# Patient Record
Sex: Female | Born: 1995 | Race: Black or African American | Hispanic: No | Marital: Single | Smoking: Former smoker
Health system: Southern US, Community
[De-identification: ages and names within clinical notes are randomized; demographics above are authoritative.]

## PROBLEM LIST (undated history)

## (undated) ENCOUNTER — Inpatient Hospital Stay (HOSPITAL_COMMUNITY): Payer: Self-pay

## (undated) DIAGNOSIS — Z789 Other specified health status: Secondary | ICD-10-CM

## (undated) HISTORY — PX: WRIST SURGERY: SHX841

## (undated) HISTORY — DX: Other specified health status: Z78.9

---
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
## 2015-02-12 ENCOUNTER — Encounter (HOSPITAL_COMMUNITY): Payer: Self-pay | Admitting: Emergency Medicine

## 2015-02-12 ENCOUNTER — Emergency Department (HOSPITAL_COMMUNITY)
Admission: EM | Admit: 2015-02-12 | Discharge: 2015-02-13 | Discharge status: Against Medical Advice | Payer: Medicaid Other | Attending: Emergency Medicine | Admitting: Emergency Medicine

## 2015-02-12 DIAGNOSIS — Z720 Tobacco use: Secondary | ICD-10-CM | POA: Insufficient documentation

## 2015-02-12 DIAGNOSIS — R300 Dysuria: Secondary | ICD-10-CM | POA: Insufficient documentation

## 2015-02-12 DIAGNOSIS — R1030 Lower abdominal pain, unspecified: Secondary | ICD-10-CM | POA: Insufficient documentation

## 2015-02-12 DIAGNOSIS — R202 Paresthesia of skin: Secondary | ICD-10-CM | POA: Insufficient documentation

## 2015-02-12 LAB — POC URINE PREG, ED: Preg Test, Ur: NEGATIVE

## 2015-02-12 NOTE — ED Notes (Signed)
Pt states she's had a tingling sensation when she urinates for the last week and a half and is also having lower abdominal pain. States unknown if she could be pregnant. Denies and abnormal bleeding/discharge from genital area. Denies back pain, N/V/D, fever/chills.

## 2015-02-13 LAB — URINALYSIS, ROUTINE W REFLEX MICROSCOPIC
Bilirubin Urine: NEGATIVE
GLUCOSE, UA: NEGATIVE mg/dL
Hgb urine dipstick: NEGATIVE
KETONES UR: NEGATIVE mg/dL
NITRITE: NEGATIVE
PROTEIN: NEGATIVE mg/dL
Specific Gravity, Urine: 1.022 (ref 1.005–1.030)
Urobilinogen, UA: 1 mg/dL (ref 0.0–1.0)
pH: 6.5 (ref 5.0–8.0)

## 2015-02-13 LAB — URINE MICROSCOPIC-ADD ON

## 2015-02-13 NOTE — ED Notes (Signed)
Pt reports that she cannot stay any longer and will possibly come back tomorrow.  Informed pt of risks associated w/ leaving AMA and pt verbalized understanding.

## 2015-05-26 ENCOUNTER — Inpatient Hospital Stay (HOSPITAL_COMMUNITY)
Admission: AD | Admit: 2015-05-26 | Discharge: 2015-05-26 | Disposition: A | Discharge status: Home / Self Care | Payer: Self-pay | Source: Ambulatory Visit | Attending: Obstetrics and Gynecology | Admitting: Obstetrics and Gynecology

## 2015-05-26 ENCOUNTER — Encounter (HOSPITAL_COMMUNITY): Payer: Self-pay | Admitting: Medical

## 2015-05-26 DIAGNOSIS — Z3201 Encounter for pregnancy test, result positive: Secondary | ICD-10-CM | POA: Insufficient documentation

## 2015-05-26 LAB — POCT PREGNANCY, URINE: Preg Test, Ur: POSITIVE — AB

## 2015-05-26 NOTE — MAU Provider Note (Signed)
[name] is a 20 y.o. G1P0 at [redacted]w[redacted]d who presents to MAU today for pregnancy verification. The patient denies abdominal pain or vaginal bleeding today.   BP 119/72 mmHg  Pulse 79  Temp(Src) 98.2 F (36.8 C)  Resp 18  LMP 03/29/2015  CONSTITUTIONAL: Well-developed, well-nourished female in no acute distress.  ENT: External right and left ear normal.  EYES: EOM intact, conjunctivae normal.  MUSCULOSKELETAL: Normal range of motion.  CARDIOVASCULAR: Regular heart rate RESPIRATORY: Normal effort NEUROLOGICAL: Alert and oriented to person, place, and time.  SKIN: Skin is warm and dry. No rash noted. Not diaphoretic. No erythema. No pallor. PSYCH: Normal mood and affect. Normal behavior. Normal judgment and thought content.  Results for orders placed or performed during the hospital encounter of 05/26/15 (from the past 24 hour(s))  Pregnancy, urine POC     Status: Abnormal   Collection Time: 05/26/15  1:15 PM  Result Value Ref Range   Preg Test, Ur POSITIVE (A) NEGATIVE    A: Positive pregnancy test  P: Discharge home Pregnancy confirmation letter and list of area OB providers given Patient advised to start taking prenatal vitamins List of OTC medications safe in pregnancy given First trimester warning signs reviewed Patient may return to MAU as needed or if her condition were to change or worsen   Marny Lowenstein, PA-C  05/26/2015 1:24 PM

## 2015-05-26 NOTE — MAU Note (Signed)
Pt presents to MAU stating that she wants to know how fra pregnant she is. Pt denies any vaginal bleeding or pain

## 2015-05-26 NOTE — Discharge Instructions (Signed)
Prenatal Care °WHAT IS PRENATAL CARE?  °Prenatal care is the process of caring for a pregnant woman before she gives birth. Prenatal care makes sure that she and her baby remain as healthy as possible throughout pregnancy. Prenatal care may be provided by a midwife, family practice health care provider, or a childbirth and pregnancy specialist (obstetrician). Prenatal care may include physical examinations, testing, treatments, and education on nutrition, lifestyle, and social support services. °WHY IS PRENATAL CARE SO IMPORTANT?  °Early and consistent prenatal care increases the chance that you and your baby will remain healthy throughout your pregnancy. This type of care also decreases a baby's risk of being born too early (prematurely), or being born smaller than expected (small for gestational age). Any underlying medical conditions you may have that could pose a risk during your pregnancy are discussed during prenatal care visits. You will also be monitored regularly for any new conditions that may arise during your pregnancy so they can be treated quickly and effectively. °WHAT HAPPENS DURING PRENATAL CARE VISITS? °Prenatal care visits may include the following: °Discussion °Tell your health care provider about any new signs or symptoms you have experienced since your last visit. These might include: °· Nausea or vomiting. °· Increased or decreased level of energy. °· Difficulty sleeping. °· Back or leg pain. °· Weight changes. °· Frequent urination. °· Shortness of breath with physical activity. °· Changes in your skin, such as the development of a rash or itchiness. °· Vaginal discharge or bleeding. °· Feelings of excitement or nervousness. °· Changes in your baby's movements. °You may want to write down any questions or topics you want to discuss with your health care provider and bring them with you to your appointment. °Examination °During your first prenatal care visit, you will likely have a complete  physical exam. Your health care provider will often examine your vagina, cervix, and the position of your uterus, as well as check your heart, lungs, and other body systems. As your pregnancy progresses, your health care provider will measure the size of your uterus and your baby's position inside your uterus. He or she may also examine you for early signs of labor. Your prenatal visits may also include checking your blood pressure and, after about 10-12 weeks of pregnancy, listening to your baby's heartbeat. °Testing °Regular testing often includes: °· Urinalysis. This checks your urine for glucose, protein, or signs of infection. °· Blood count. This checks the levels of white and red blood cells in your body. °· Tests for sexually transmitted infections (STIs). Testing for STIs at the beginning of pregnancy is routinely done and is required in many states. °· Antibody testing. You will be checked to see if you are immune to certain illnesses, such as rubella, that can affect a developing fetus. °· Glucose screen. Around 24-28 weeks of pregnancy, your blood glucose level will be checked for signs of gestational diabetes. Follow-up tests may be recommended. °· Group B strep. This is a bacteria that is commonly found inside a woman's vagina. This test will inform your health care provider if you need an antibiotic to reduce the amount of this bacteria in your body prior to labor and childbirth. °· Ultrasound. Many pregnant women undergo an ultrasound screening around 18-20 weeks of pregnancy to evaluate the health of the fetus and check for any developmental abnormalities. °· HIV (human immunodeficiency virus) testing. Early in your pregnancy, you will be screened for HIV. If you are at high risk for HIV, this test   may be repeated during your third trimester of pregnancy. You may be offered other testing based on your age, personal or family medical history, or other factors.  HOW OFTEN SHOULD I PLAN TO SEE MY  HEALTH CARE PROVIDER FOR PRENATAL CARE? Your prenatal care check-up schedule depends on any medical conditions you have before, or develop during, your pregnancy. If you do not have any underlying medical conditions, you will likely be seen for checkups:  Monthly, during the first 6 months of pregnancy.  Twice a month during months 7 and 8 of pregnancy.  Weekly starting in the 9th month of pregnancy and until delivery. If you develop signs of early labor or other concerning signs or symptoms, you may need to see your health care provider more often. Ask your health care provider what prenatal care schedule is best for you. WHAT CAN I DO TO KEEP MYSELF AND MY BABY AS HEALTHY AS POSSIBLE DURING MY PREGNANCY?  Take a prenatal vitamin containing 400 micrograms (0.4 mg) of folic acid every day. Your health care provider may also ask you to take additional vitamins such as iodine, vitamin D, iron, copper, and zinc.  Take 1500-2000 mg of calcium daily starting at your 20th week of pregnancy until you deliver your baby.  Make sure you are up to date on your vaccinations. Unless directed otherwise by your health care provider:  You should receive a tetanus, diphtheria, and pertussis (Tdap) vaccination between the 27th and 36th week of your pregnancy, regardless of when your last Tdap immunization occurred. This helps protect your baby from whooping cough (pertussis) after he or she is born.  You should receive an annual inactivated influenza vaccine (IIV) to help protect you and your baby from influenza. This can be done at any point during your pregnancy.  Eat a well-rounded diet that includes:  Fresh fruits and vegetables.  Lean proteins.  Calcium-rich foods such as milk, yogurt, hard cheeses, and dark, leafy greens.  Whole grain breads.  Do noteat seafood high in mercury, including:  Swordfish.  Tilefish.  Shark.  King mackerel.  More than 6 oz tuna per week.  Do not eat:  Raw  or undercooked meats or eggs.  Unpasteurized foods, such as soft cheeses (brie, blue, or feta), juices, and milks.  Lunch meats.  Hot dogs that have not been heated until they are steaming.  Drink enough water to keep your urine clear or pale yellow. For many women, this may be 10 or more 8 oz glasses of water each day. Keeping yourself hydrated helps deliver nutrients to your baby and may prevent the start of pre-term uterine contractions.  Do not use any tobacco products including cigarettes, chewing tobacco, or electronic cigarettes. If you need help quitting, ask your health care provider.  Do not drink beverages containing alcohol. No safe level of alcohol consumption during pregnancy has been determined.  Do not use any illegal drugs. These can harm your developing baby or cause a miscarriage.  Ask your health care provider or pharmacist before taking any prescription or over-the-counter medicines, herbs, or supplements.  Limit your caffeine intake to no more than 200 mg per day.  Exercise. Unless told otherwise by your health care provider, try to get 30 minutes of moderate exercise most days of the week. Do not  do high-impact activities, contact sports, or activities with a high risk of falling, such as horseback riding or downhill skiing.  Get plenty of rest.  Avoid anything that raises your  body temperature, such as hot tubs and saunas.  If you own a cat, do not empty its litter box. Bacteria contained in cat feces can cause an infection called toxoplasmosis. This can result in serious harm to the fetus.  Stay away from chemicals such as insecticides, lead, mercury, and cleaning or paint products that contain solvents.  Do not have any X-rays taken unless medically necessary.  Take a childbirth and breastfeeding preparation class. Ask your health care provider if you need a referral or recommendation.   This information is not intended to replace advice given to you by  your health care provider. Make sure you discuss any questions you have with your health care provider.   Document Released: 04/25/2003 Document Revised: 05/13/2014 Document Reviewed: 07/07/2013 Elsevier Interactive Patient Education 2016 [employer] First Trimester of Pregnancy The first trimester of pregnancy is from week 1 until the end of week 12 (months 1 through 3). During this time, your baby will begin to develop inside you. At 6-8 weeks, the eyes and face are formed, and the heartbeat can be seen on ultrasound. At the end of 12 weeks, all the baby's organs are formed. Prenatal care is all the medical care you receive before the birth of your baby. Make sure you get good prenatal care and follow all of your doctor's instructions. HOME CARE  Medicines  Take medicine only as told by your doctor. Some medicines are safe and some are not during pregnancy.  Take your prenatal vitamins as told by your doctor.  Take medicine that helps you poop (stool softener) as needed if your doctor says it is okay. Diet  Eat regular, healthy meals.  Your doctor will tell you the amount of weight gain that is right for you.  Avoid raw meat and uncooked cheese.  If you feel sick to your stomach (nauseous) or throw up (vomit):  Eat 4 or 5 small meals a day instead of 3 large meals.  Try eating a few soda crackers.  Drink liquids between meals instead of during meals.  If you have a hard time pooping (constipation):  Eat high-fiber foods like fresh vegetables, fruit, and whole grains.  Drink enough fluids to keep your pee (urine) clear or pale yellow. Activity and Exercise  Exercise only as told by your doctor. Stop exercising if you have cramps or pain in your lower belly (abdomen) or low back.  Try to avoid standing for long periods of time. Move your legs often if you must stand in one place for a long time.  Avoid heavy lifting.  Wear low-heeled shoes. Sit and stand up  straight.  You can have sex unless your doctor tells you not to. Relief of Pain or Discomfort  Wear a good support bra if your breasts are sore.  Take warm water baths (sitz baths) to soothe pain or discomfort caused by hemorrhoids. Use hemorrhoid cream if your doctor says it is okay.  Rest with your legs raised if you have leg cramps or low back pain.  Wear support hose if you have puffy, bulging veins (varicose veins) in your legs. Raise (elevate) your feet for 15 minutes, 3-4 times a day. Limit salt in your diet. Prenatal Care  Schedule your prenatal visits by the twelfth week of pregnancy.  Write down your questions. Take them to your prenatal visits.  Keep all your prenatal visits as told by your doctor. Safety  Wear your seat belt at all times when driving.  Make a  list of emergency phone numbers. The list should include numbers for family, friends, the hospital, and police and fire departments. General Tips  Ask your doctor for a referral to a local prenatal class. Begin classes no later than at the start of month 6 of your pregnancy.  Ask for help if you need counseling or help with nutrition. Your doctor can give you advice or tell you where to go for help.  Do not use hot tubs, steam rooms, or saunas.  Do not douche or use tampons or scented sanitary pads.  Do not cross your legs for long periods of time.  Avoid litter boxes and soil used by cats.  Avoid all smoking, herbs, and alcohol. Avoid drugs not approved by your doctor.  Do not use any tobacco products, including cigarettes, chewing tobacco, and electronic cigarettes. If you need help quitting, ask your doctor. You may get counseling or other support to help you quit.  Visit your dentist. At home, brush your teeth with a soft toothbrush. Be gentle when you floss. GET HELP IF:  You are dizzy.  You have mild cramps or pressure in your lower belly.  You have a nagging pain in your belly area.  You  continue to feel sick to your stomach, throw up, or have watery poop (diarrhea).  You have a bad smelling fluid coming from your vagina.  You have pain with peeing (urination).  You have increased puffiness (swelling) in your face, hands, legs, or ankles. GET HELP RIGHT AWAY IF:   You have a fever.  You are leaking fluid from your vagina.  You have spotting or bleeding from your vagina.  You have very bad belly cramping or pain.  You gain or lose weight rapidly.  You throw up blood. It may look like coffee grounds.  You are around people who have [place] measles, fifth disease, or chickenpox.  You have a very bad headache.  You have shortness of breath.  You have any kind of trauma, such as from a fall or a car accident.   This information is not intended to replace advice given to you by your health care provider. Make sure you discuss any questions you have with your health care provider.   Document Released: 10/09/2007 Document Revised: 05/13/2014 Document Reviewed: 03/02/2013 Elsevier Interactive Patient Education [employer]

## 2015-07-25 ENCOUNTER — Ambulatory Visit (INDEPENDENT_AMBULATORY_CARE_PROVIDER_SITE_OTHER): Payer: Self-pay | Admitting: Advanced Practice Midwife

## 2015-07-25 ENCOUNTER — Encounter: Payer: Self-pay | Admitting: Advanced Practice Midwife

## 2015-07-25 ENCOUNTER — Encounter: Payer: Self-pay | Admitting: Family Medicine

## 2015-07-25 VITALS — BP 122/58 | HR 89 | Temp 98.5°F | Wt 123.5 lb

## 2015-07-25 DIAGNOSIS — Z23 Encounter for immunization: Secondary | ICD-10-CM

## 2015-07-25 DIAGNOSIS — Z113 Encounter for screening for infections with a predominantly sexual mode of transmission: Secondary | ICD-10-CM

## 2015-07-25 DIAGNOSIS — Z3402 Encounter for supervision of normal first pregnancy, second trimester: Secondary | ICD-10-CM

## 2015-07-25 DIAGNOSIS — Z3689 Encounter for other specified antenatal screening: Secondary | ICD-10-CM

## 2015-07-25 DIAGNOSIS — Z3400 Encounter for supervision of normal first pregnancy, unspecified trimester: Secondary | ICD-10-CM | POA: Insufficient documentation

## 2015-07-25 LAB — POCT URINALYSIS DIP (DEVICE)
BILIRUBIN URINE: NEGATIVE
Glucose, UA: NEGATIVE mg/dL
Ketones, ur: NEGATIVE mg/dL
NITRITE: NEGATIVE
Protein, ur: NEGATIVE mg/dL
Specific Gravity, Urine: 1.025 (ref 1.005–1.030)
Urobilinogen, UA: 0.2 mg/dL (ref 0.0–1.0)
pH: 7 (ref 5.0–8.0)

## 2015-07-25 NOTE — Progress Notes (Signed)
   Subjective:    Catherine Hayes is a G1P0 [id number] being seen today for her first obstetrical visit.  Her obstetrical history is significant for none G1. Pregnancy history fully reviewed.  Patient reports no complaints.  Filed Vitals:   07/25/15 1347  BP: 122/58  Pulse: 89  Temp: 98.5 F (36.9 C)  Weight: 123 lb 8 oz (56.019 kg)    HISTORY: OB History  Gravida Para Term Preterm AB SAB TAB Ectopic Multiple Living  1             # Outcome Date GA Lbr Len/2nd Weight Sex Delivery Anes PTL Lv  1 Current              History reviewed. No pertinent past medical history. Past Surgical History  Procedure Laterality Date  . Wrist surgery    . Anterior lat lumbar fusion N/A 08/03/2012    Procedure: ANTERIOR LATERAL LUMBAR L1 CORPECTOMY 1 LEVEL;  Surgeon: Tia Alertavid S Jones, MD;  Location: [facility];  Service: Neurosurgery;  Laterality: N/A;  Anteriolateral lumbar one corpectomy, strut graft and plating   History reviewed. No pertinent family history.   Exam    Uterus:     Pelvic Exam: Deferred r/t pt age  System: Breast:  normal appearance, no masses or tenderness   Skin: normal coloration and turgor, no rashes    Neurologic: oriented, normal, normal mood, gait normal; reflexes normal and symmetric   Extremities: normal strength, tone, and muscle mass, ROM of all joints is normal   HEENT sclera clear, anicteric   Mouth/Teeth mucous membranes moist, pharynx normal without lesions and dental hygiene good   Neck supple and no masses   Cardiovascular: Heart sound wnl   Respiratory:  appears well, vitals normal, no respiratory distress, acyanotic, normal RR, ear and throat exam is normal, neck free of mass or lymphadenopathy, chest clear, no wheezing, crepitations, rhonchi, normal symmetric air entry   Abdomen: soft, non-tender; bowel sounds normal; no masses,  no organomegaly   Urinary: not evaluated      Assessment:    Pregnancy: G1P0 1. Encounter for supervision of normal first  pregnancy in second trimester  - Prenatal Vit-Fe Fumarate-FA (PRENATAL VITAMINS PLUS) 27-1 MG TABS; Take 1 tablet by mouth daily. - POCT urinalysis dip (device) - Culture, OB Urine - GC[facility]Chlamydia probe amp ([facility] - Hemoglobinopathy evaluation - US MFM OB COMP + 14 WK; Future - Prenatal Profile - Prescript Monitor Profile(19) - AFP, Quad Screen - Flu Vaccine QUAD 36+ mos IM; Standing - Flu Vaccine QUAD 36+ mos IM  2. Encounter for fetal anatomic survey  - US MFM OB COMP + 14 WK; Future      Plan:     Initial labs drawn. Prenatal vitamins. Problem list reviewed and updated. Genetic Screening discussed Quad Screen: ordered.  Ultrasound discussed; fetal survey: ordered.  Follow up in 4 weeks.   LEFTWICH-KIRBY, Caroleann Casler 07/25/2015

## 2015-07-25 NOTE — Patient Instructions (Addendum)
Your ultrasound is scheduled on April 6 at 10:15 am, please arrive at 10:00.     Considering Waterbirth? Guide for patients at [facility]  Why consider waterbirth?  . Gentle birth for babies . Less pain medicine used in labor . May allow for passive descent/less pushing . May reduce perineal tears  . More mobility and instinctive maternal position changes . Increased maternal relaxation . Reduced blood pressure in labor  Is waterbirth safe? What are the risks of infection, drowning or other complications?  . Infection: o Very low risk (3.7 % for tub vs 4.8% for bed) o 7 in 8000 waterbirths with documented infection o Poorly cleaned equipment most common cause o Slightly lower group B strep transmission rate  . Drowning o Maternal:  - Very low risk   - Related to seizures or fainting o Newborn:  - Very low risk. No evidence of increased risk of respiratory problems in multiple large studies - Physiological protection from breathing under water - Avoid underwater birth if there are any fetal complications - Once baby's head is out of the water, keep it out.  . Birth complication o Some reports of cord trauma, but risk decreased by bringing baby to surface gradually o No evidence of increased risk of shoulder dystocia. Mothers can usually change positions faster in water than in a bed, possibly aiding the maneuvers to free the shoulder.   Am I a candidate for waterbirth?  Yes, if you are: . Full-term (37 weeks or greater)  . Have had an uncomplicated pregnancy and labor  No, if you have: Marland Kitchen. Preterm birth less than 37 weeks . Thick, particulate meconium stained fluid . Maternal fever over 101 . Heavy bleeding or signs of placental abruption . Pre-eclampsia  . Any abnormal fetal heart rate pattern . Breech presentation . Twins  . Very large baby . Active communicable infection (this does NOT include group B strep) . Significant limitation to  mobility  Please remember that birth is unpredictable. Under certain unforeseeable circumstances your provider may advise against giving birth in the tub. These decisions will be made on a case-by-case basis and with the safety of you and your baby as our highest priority.  Requirements for patients planning waterbirth  . Ask your midwife if you will be a candidate for waterbirth. . Attend the Noelle [facility]Waterbirth Class [facility]. Contact Childbirth Education at [phone] or [phone] for dates and times. The class is free and we strongly encourage you to bring your support person. You will receive a certificate of participation to show to your midwife or doctor. . Supplies needed for [facility] and [facility] patients: o Single-use disposable tub liner (birthpoolinabox.com  REGULAR size) o New garden hose labeled "lead-free", "suitable for drinking water", "non-toxic" OR "water potable" o Garden hose to remove the dirty water o Faucet adaptor to attach hose to faucet         o Electric drain pump to remove water (We recommend 792 gallon per hour or greater pump.)  o Fish net o Bathing suit top (optional) o Long-handled mirror (optional)  [email or web address] sells tubs for $120 if you would rather purchase your own tub

## 2015-07-25 NOTE — Progress Notes (Signed)
Pain/pressure- lower abd  New ob packet given  Flu vaccine consented and info given  Anatomy US scheduled for April 6th @ 1000

## 2015-07-26 LAB — PRENATAL PROFILE (SOLSTAS)
ANTIBODY SCREEN: NEGATIVE
Basophils Absolute: 0 10*3/uL (ref 0.0–0.1)
Basophils Relative: 0 % (ref 0–1)
EOS ABS: 0.1 10*3/uL (ref 0.0–0.7)
EOS PCT: 1 % (ref 0–5)
HCT: 35.8 % — ABNORMAL LOW (ref 36.0–46.0)
HIV 1&2 Ab, 4th Generation: NONREACTIVE
Hemoglobin: 12.2 g/dL (ref 12.0–15.0)
Hepatitis B Surface Ag: NEGATIVE
LYMPHS ABS: 1.5 10*3/uL (ref 0.7–4.0)
Lymphocytes Relative: 13 % (ref 12–46)
MCH: 31 pg (ref 26.0–34.0)
MCHC: 34.1 g/dL (ref 30.0–36.0)
MCV: 91.1 fL (ref 78.0–100.0)
MONO ABS: 0.8 10*3/uL (ref 0.1–1.0)
MONOS PCT: 7 % (ref 3–12)
MPV: 9.7 fL (ref 8.6–12.4)
Neutro Abs: 8.9 10*3/uL — ABNORMAL HIGH (ref 1.7–7.7)
Neutrophils Relative %: 79 % — ABNORMAL HIGH (ref 43–77)
PLATELETS: 248 10*3/uL (ref 150–400)
RBC: 3.93 MIL/uL (ref 3.87–5.11)
RDW: 13.7 % (ref 11.5–15.5)
RH TYPE: POSITIVE
RUBELLA: 3.97 {index} — AB (ref <0.90)
WBC: 11.3 10*3/uL — ABNORMAL HIGH (ref 4.0–10.5)

## 2015-07-26 LAB — AFP, QUAD SCREEN
AFP: 38.5 ng/mL
Age Alone: 1:1180 {titer}
CURR GEST AGE: 16.6 wks.days
HCG, Total: 31.4 IU/mL
INH: 151.7 pg/mL
Interpretation-AFP: NEGATIVE
MoM for AFP: 0.96
MoM for INH: 0.81
MoM for hCG: 0.84
Open Spina bifida: NEGATIVE
Tri 18 Scr Risk Est: NEGATIVE
Trisomy 18 (Edward) Syndrome Interp.: 1:13200 {titer}
UE3 MOM: 0.64
UE3 VALUE: 0.67 ng/mL

## 2015-07-26 LAB — CULTURE, OB URINE
Colony Count: NO GROWTH
Organism ID, Bacteria: NO GROWTH

## 2015-07-26 LAB — GC/CHLAMYDIA PROBE AMP (~~LOC~~) NOT AT ARMC
Chlamydia: POSITIVE — AB
Neisseria Gonorrhea: POSITIVE — AB

## 2015-07-27 LAB — HEMOGLOBINOPATHY EVALUATION
HGB F QUANT: 0 % (ref 0.0–2.0)
HGB S QUANTITAION: 0 %
Hemoglobin Other: 0 %
Hgb A2 Quant: 2.8 % (ref 2.2–3.2)
Hgb A: 97.2 % (ref 96.8–97.8)

## 2015-07-28 ENCOUNTER — Telehealth: Payer: Self-pay | Admitting: *Deleted

## 2015-07-28 ENCOUNTER — Encounter: Payer: Self-pay | Admitting: Advanced Practice Midwife

## 2015-07-28 ENCOUNTER — Encounter: Payer: Self-pay | Admitting: *Deleted

## 2015-07-28 DIAGNOSIS — A64 Unspecified sexually transmitted disease: Secondary | ICD-10-CM | POA: Insufficient documentation

## 2015-07-28 DIAGNOSIS — O98319 Other infections with a predominantly sexual mode of transmission complicating pregnancy, unspecified trimester: Secondary | ICD-10-CM

## 2015-07-28 NOTE — Telephone Encounter (Signed)
Patient has positive gonorrhea and chlamydia test. Attempted to call patient on mobile number, patient picked up but connection was bad and call was disconnected. Was unable to leave message due to voice mail not being set up yet. Called mother's number. Left message on voice mail stating I tried patient's number but was disconnected and unable to leave message. Please have her call the clinic for test results on Monday. STD card completed.

## 2015-07-29 LAB — PRESCRIPTION MONITORING PROFILE (19 PANEL)
AMPHETAMINE/METH: NEGATIVE ng/mL
BUPRENORPHINE, URINE: NEGATIVE ng/mL
Barbiturate Screen, Urine: NEGATIVE ng/mL
Benzodiazepine Screen, Urine: NEGATIVE ng/mL
Carisoprodol, Urine: NEGATIVE ng/mL
Cocaine Metabolites: NEGATIVE ng/mL
Creatinine, Urine: 208.15 mg/dL (ref >20.0)
ECSTASY: NEGATIVE ng/mL
FENTANYL URINE: NEGATIVE ng/mL
METHADONE SCREEN, URINE: NEGATIVE ng/mL
Meperidine, Ur: NEGATIVE ng/mL
Methaqualone: NEGATIVE ng/mL
NITRITES URINE, INITIAL: NEGATIVE ug/mL
OXYCODONE SCRN UR: NEGATIVE ng/mL
Opiate Screen, Urine: NEGATIVE ng/mL
PROPOXYPHENE: NEGATIVE ng/mL
Phencyclidine, Ur: NEGATIVE ng/mL
TAPENTADOLUR: NEGATIVE ng/mL
TRAMADOL UR: NEGATIVE ng/mL
Zolpidem, Urine: NEGATIVE ng/mL
pH, Initial: 6.9 pH (ref 4.5–8.9)

## 2015-07-29 LAB — CANNABANOIDS (GC/LC/MS), URINE: THC-COOH (GC/LC/MS), ur confirm: 1311 ng/mL — AB (ref <5)

## 2015-07-31 NOTE — Telephone Encounter (Signed)
Called pt and left message stating that I am calling with important test result information. Please call back and leave message stating whether a detailed message can be left on her voice mail. Pt needs appt to receive Rocephin 250 mg IM and Azithromycin 1000 mg po in office.

## 2015-08-02 NOTE — Telephone Encounter (Signed)
According to note [facility]

## 2015-08-02 NOTE — Telephone Encounter (Signed)
Patient returned call requesting to talk to nurse. Patient said it was okay to leave detailed message on voicemail, but wanted to inform office that if call was regarding Chlamydia that she has been treated at [facility] department.

## 2015-08-03 ENCOUNTER — Telehealth: Payer: Self-pay | Admitting: *Deleted

## 2015-08-03 ENCOUNTER — Ambulatory Visit (INDEPENDENT_AMBULATORY_CARE_PROVIDER_SITE_OTHER): Payer: Self-pay | Admitting: Obstetrics & Gynecology

## 2015-08-03 VITALS — BP 122/65 | HR 67 | Temp 98.2°F | Wt 123.8 lb

## 2015-08-03 DIAGNOSIS — A64 Unspecified sexually transmitted disease: Secondary | ICD-10-CM

## 2015-08-03 DIAGNOSIS — O98319 Other infections with a predominantly sexual mode of transmission complicating pregnancy, unspecified trimester: Secondary | ICD-10-CM

## 2015-08-03 DIAGNOSIS — Z3402 Encounter for supervision of normal first pregnancy, second trimester: Secondary | ICD-10-CM

## 2015-08-03 DIAGNOSIS — O26852 Spotting complicating pregnancy, second trimester: Secondary | ICD-10-CM

## 2015-08-03 LAB — POCT URINALYSIS DIP (DEVICE)
BILIRUBIN URINE: NEGATIVE
Glucose, UA: NEGATIVE mg/dL
Ketones, ur: NEGATIVE mg/dL
Nitrite: NEGATIVE
PH: 7.5 (ref 5.0–8.0)
Protein, ur: NEGATIVE mg/dL
SPECIFIC GRAVITY, URINE: 1.02 (ref 1.005–1.030)
Urobilinogen, UA: 0.2 mg/dL (ref 0.0–1.0)

## 2015-08-03 NOTE — Progress Notes (Signed)
Patient has been spotting the last 2 days, saw blood in the toilet this morning

## 2015-08-03 NOTE — Telephone Encounter (Signed)
Patient called and stated that she is having spotting and lots of cramps. Advised patient that this can be normal with stds. Patient says she is very concerned and is adamant that she needs to be seen today or she is going to go to ED. Advised patient that we will work her in to our schedule. Patient voiced appreciation and had no further questions.

## 2015-08-03 NOTE — Progress Notes (Signed)
Subjective:  Catherine Hayes is a 20 y.o. G1P0 at 3857w1d being seen today for ongoing prenatal care.  She is currently monitored for the following issues for this low-risk pregnancy and has Supervision of normal first pregnancy and STD (sexually transmitted disease) complicating pregnancy, antepartum on her problem list.  Patient reports spotting for a couple of days, was recently treated for GC and chlamydia.  Partner is in jail, also received treatment.  Contractions: Not present. Vag. Bleeding: None.  Denies leaking of fluid.   The following portions of the patient's history were reviewed and updated as appropriate: allergies, current medications, past family history, past medical history, past social history, past surgical history and problem list. Problem list updated.  Objective:   Filed Vitals:   08/03/15 1028  BP: 122/65  Pulse: 67  Temp: 98.2 F (36.8 C)  Weight: 123 lb 12.8 oz (56.155 kg)    Fetal Status: Fetal Heart Rate (bpm): 148         General:  Alert, oriented and cooperative. Patient is in no acute distress.  Skin: Skin is warm and dry. No rash noted.   Cardiovascular: Normal heart rate noted  Respiratory: Normal respiratory effort, no problems with respiration noted  Abdomen: Soft, gravid, appropriate for gestational age. Pain/Pressure: Present     Pelvic: Vag. Bleeding: None   Sterile speculum Cervical exam performed  Dilation: Closed Effacement (%): Thick    Extremities: Normal range of motion.  Edema: None  Mental Status: Normal mood and affect. Normal behavior. Normal judgment and thought content.   Urinalysis: Urine Protein: Negative Urine Glucose: Negative  Assessment and Plan:  Pregnancy: G1P0 at 4257w1d  1. Spotting affecting pregnancy in second trimester 2. STD (sexually transmitted disease) complicating pregnancy, antepartum Patient assured that spotting can occur as a result of recent STIs; this is a risk factor for miscarriage/PTL.  Closed cervix today.  Patient reassure  3. Encounter for supervision of normal first pregnancy in second trimester Anatomy scan scheduled on 08/10/15 Routine obstetric precautions reviewed. Please refer to After Visit Summary for other counseling recommendations.  Return for prenatal care as scheduled.   Tereso NewcomerUgonna A Latoya Diskin, MD

## 2015-08-03 NOTE — Patient Instructions (Signed)
Return to clinic for any scheduled appointments or obstetric concerns, or go to MAU for evaluation  

## 2015-08-10 ENCOUNTER — Ambulatory Visit (HOSPITAL_COMMUNITY)
Admission: RE | Admit: 2015-08-10 | Discharge: 2015-08-10 | Disposition: A | Discharge status: Home / Self Care | Payer: Medicaid Other | Source: Ambulatory Visit | Attending: Advanced Practice Midwife | Admitting: Advanced Practice Midwife

## 2015-08-10 DIAGNOSIS — Z36 Encounter for antenatal screening of mother: Secondary | ICD-10-CM | POA: Diagnosis present

## 2015-08-10 DIAGNOSIS — Z3402 Encounter for supervision of normal first pregnancy, second trimester: Secondary | ICD-10-CM

## 2015-08-10 DIAGNOSIS — Z3689 Encounter for other specified antenatal screening: Secondary | ICD-10-CM

## 2015-08-10 DIAGNOSIS — Z3A19 19 weeks gestation of pregnancy: Secondary | ICD-10-CM | POA: Insufficient documentation

## 2015-08-16 ENCOUNTER — Encounter (HOSPITAL_COMMUNITY): Payer: Self-pay

## 2015-08-16 ENCOUNTER — Inpatient Hospital Stay (HOSPITAL_COMMUNITY)
Admission: AD | Admit: 2015-08-16 | Discharge: 2015-08-17 | DRG: 774 | Disposition: A | Discharge status: Home / Self Care | Payer: Medicaid Other | Source: Ambulatory Visit | Attending: Obstetrics and Gynecology | Admitting: Obstetrics and Gynecology

## 2015-08-16 ENCOUNTER — Inpatient Hospital Stay (HOSPITAL_COMMUNITY): Payer: Medicaid Other

## 2015-08-16 DIAGNOSIS — O3430 Maternal care for cervical incompetence, unspecified trimester: Secondary | ICD-10-CM | POA: Diagnosis present

## 2015-08-16 DIAGNOSIS — A5602 Chlamydial vulvovaginitis: Secondary | ICD-10-CM | POA: Diagnosis present

## 2015-08-16 DIAGNOSIS — IMO0002 Reserved for concepts with insufficient information to code with codable children: Secondary | ICD-10-CM | POA: Diagnosis not present

## 2015-08-16 DIAGNOSIS — O9832 Other infections with a predominantly sexual mode of transmission complicating childbirth: Secondary | ICD-10-CM | POA: Diagnosis present

## 2015-08-16 DIAGNOSIS — A64 Unspecified sexually transmitted disease: Secondary | ICD-10-CM | POA: Diagnosis present

## 2015-08-16 DIAGNOSIS — O98319 Other infections with a predominantly sexual mode of transmission complicating pregnancy, unspecified trimester: Secondary | ICD-10-CM

## 2015-08-16 DIAGNOSIS — O9822 Gonorrhea complicating childbirth: Secondary | ICD-10-CM | POA: Diagnosis present

## 2015-08-16 DIAGNOSIS — O3432 Maternal care for cervical incompetence, second trimester: Secondary | ICD-10-CM | POA: Diagnosis present

## 2015-08-16 DIAGNOSIS — O411220 Chorioamnionitis, second trimester, not applicable or unspecified: Secondary | ICD-10-CM | POA: Diagnosis present

## 2015-08-16 DIAGNOSIS — O42912 Preterm premature rupture of membranes, unspecified as to length of time between rupture and onset of labor, second trimester: Secondary | ICD-10-CM | POA: Diagnosis present

## 2015-08-16 DIAGNOSIS — Z3A20 20 weeks gestation of pregnancy: Secondary | ICD-10-CM | POA: Diagnosis not present

## 2015-08-16 DIAGNOSIS — F17210 Nicotine dependence, cigarettes, uncomplicated: Secondary | ICD-10-CM | POA: Diagnosis present

## 2015-08-16 DIAGNOSIS — A749 Chlamydial infection, unspecified: Secondary | ICD-10-CM | POA: Diagnosis present

## 2015-08-16 DIAGNOSIS — O98819 Other maternal infectious and parasitic diseases complicating pregnancy, unspecified trimester: Secondary | ICD-10-CM

## 2015-08-16 DIAGNOSIS — A5901 Trichomonal vulvovaginitis: Secondary | ICD-10-CM | POA: Diagnosis present

## 2015-08-16 DIAGNOSIS — O99334 Smoking (tobacco) complicating childbirth: Secondary | ICD-10-CM | POA: Diagnosis present

## 2015-08-16 DIAGNOSIS — O98219 Gonorrhea complicating pregnancy, unspecified trimester: Secondary | ICD-10-CM | POA: Diagnosis present

## 2015-08-16 DIAGNOSIS — Z3400 Encounter for supervision of normal first pregnancy, unspecified trimester: Secondary | ICD-10-CM

## 2015-08-16 DIAGNOSIS — O42012 Preterm premature rupture of membranes, onset of labor within 24 hours of rupture, second trimester: Secondary | ICD-10-CM

## 2015-08-16 DIAGNOSIS — A599 Trichomoniasis, unspecified: Secondary | ICD-10-CM | POA: Diagnosis present

## 2015-08-16 LAB — CBC
HEMATOCRIT: 30.3 % — AB (ref 36.0–46.0)
HEMOGLOBIN: 10.5 g/dL — AB (ref 12.0–15.0)
MCH: 31.1 pg (ref 26.0–34.0)
MCHC: 34.7 g/dL (ref 30.0–36.0)
MCV: 89.6 fL (ref 78.0–100.0)
PLATELETS: 197 10*3/uL (ref 150–400)
RBC: 3.38 MIL/uL — ABNORMAL LOW (ref 3.87–5.11)
RDW: 13.4 % (ref 11.5–15.5)
WBC: 14.8 10*3/uL — ABNORMAL HIGH (ref 4.0–10.5)

## 2015-08-16 LAB — URINALYSIS, ROUTINE W REFLEX MICROSCOPIC
Bilirubin Urine: NEGATIVE
Glucose, UA: NEGATIVE mg/dL
KETONES UR: NEGATIVE mg/dL
Nitrite: NEGATIVE
PROTEIN: NEGATIVE mg/dL
Specific Gravity, Urine: 1.01 (ref 1.005–1.030)
pH: 5.5 (ref 5.0–8.0)

## 2015-08-16 LAB — TYPE AND SCREEN
ABO/RH(D): B POS
Antibody Screen: NEGATIVE

## 2015-08-16 LAB — URINE MICROSCOPIC-ADD ON: RBC / HPF: NONE SEEN RBC/hpf (ref 0–5)

## 2015-08-16 LAB — RAPID URINE DRUG SCREEN, HOSP PERFORMED
AMPHETAMINES: NOT DETECTED
BENZODIAZEPINES: NOT DETECTED
Barbiturates: NOT DETECTED
Cocaine: NOT DETECTED
OPIATES: NOT DETECTED
Tetrahydrocannabinol: POSITIVE — AB

## 2015-08-16 LAB — WET PREP, GENITAL
CLUE CELLS WET PREP: NONE SEEN
Sperm: NONE SEEN
YEAST WET PREP: NONE SEEN

## 2015-08-16 MED ORDER — CALCIUM CARBONATE ANTACID 500 MG PO CHEW
2.0000 | CHEWABLE_TABLET | ORAL | Status: DC | PRN
  Filled 2015-08-16: qty 2

## 2015-08-16 MED ORDER — KCL IN DEXTROSE-NACL 10-5-0.45 MEQ/L-%-% IV SOLN
INTRAVENOUS | Status: DC
  Administered 2015-08-16: 22:00:00 via INTRAVENOUS
  Filled 2015-08-16 (×3): qty 1000

## 2015-08-16 MED ORDER — ZOLPIDEM TARTRATE 5 MG PO TABS: 5.0000 mg | ORAL_TABLET | Freq: Every evening | ORAL | Status: DC | PRN

## 2015-08-16 MED ORDER — ACETAMINOPHEN 325 MG PO TABS: 650.0000 mg | ORAL_TABLET | ORAL | Status: DC | PRN

## 2015-08-16 MED ORDER — NIFEDIPINE 10 MG PO CAPS
10.0000 mg | ORAL_CAPSULE | Freq: Four times a day (QID) | ORAL | Status: DC
  Filled 2015-08-16: qty 1

## 2015-08-16 MED ORDER — DOCUSATE SODIUM 100 MG PO CAPS
100.0000 mg | ORAL_CAPSULE | Freq: Every day | ORAL | Status: DC
  Administered 2015-08-17: 100 mg via ORAL
  Filled 2015-08-16 (×4): qty 1

## 2015-08-16 MED ORDER — PRENATAL MULTIVITAMIN CH
1.0000 | ORAL_TABLET | Freq: Every day | ORAL | Status: DC
  Filled 2015-08-16: qty 1

## 2015-08-16 NOTE — MAU Note (Signed)
Pt reports onset of lower abd pressure when she was trying to have a BM about one hour ago and states it feels like a balloon is coming out of her vagina.

## 2015-08-16 NOTE — H&P (Signed)
ANTEPARTUM ADMISSION HISTORY AND PHYSICAL NOTE   History of Present Illness: Catherine Hayes is a 20 y.o. G1P0 at 6183w0d presenting complaining of feeling something bulging in her vagina.  Started this morning. No contractions, no bleeding, no leakage of fluid, no dysuria or hematuria, no fevers. Chlamydia and gonorrhea diagnosed 3/24 and [facility]. No sex since   Patient Active Problem List   Diagnosis Date Noted  . Cervical insufficiency during pregnancy in second trimester, antepartum 08/16/2015  . STD (sexually transmitted disease) complicating pregnancy, antepartum 07/28/2015  . Supervision of normal first pregnancy 07/25/2015    History reviewed. No pertinent past medical history.  Past Surgical History  Procedure Laterality Date  . Wrist surgery    . Anterior lat lumbar fusion N/A 08/03/2012    Procedure: ANTERIOR LATERAL LUMBAR L1 CORPECTOMY 1 LEVEL;  Surgeon: Tia Alertavid S Jones, MD;  Location: [facility];  Service: Neurosurgery;  Laterality: N/A;  Anteriolateral lumbar one corpectomy, strut graft and plating    OB History  Gravida Para Term Preterm AB SAB TAB Ectopic Multiple Living  1             # Outcome Date GA Lbr Len/2nd Weight Sex Delivery Anes PTL Lv  1 Current               Social History   Social History  . Marital Status: Single    Spouse Name: N/A  . Number of Children: N/A  . Years of Education: N/A   Social History Main Topics  . Smoking status: Current Every Day Smoker    Types: Cigarettes  . Smokeless tobacco: Never Used  . Alcohol Use: No  . Drug Use: No  . Sexual Activity: No   Other Topics Concern  . None   Social History Narrative    History reviewed. No pertinent family history.  No Known Allergies  Prescriptions prior to admission  Medication Sig Dispense Refill Last Dose  . acetaminophen (TYLENOL) 500 MG tablet Take 500 mg by mouth every 6 (six) hours as needed for headache.   Past Week at Unknown time  . Prenatal  Vit-Fe Fumarate-FA (PRENATAL VITAMINS PLUS) 27-1 MG TABS Take 1 tablet by mouth daily.   Past Week at Unknown time    Review of Systems - Negative except as per hpi  Vitals:  BP 130/68 mmHg  Pulse 97  Temp(Src) 98.1 F (36.7 C) (Oral)  Resp 16  Ht 5\' 5"  (1.651 m)  Wt 133 lb (60.328 kg)  BMI 22.13 kg/m2  SpO2 100%  LMP 03/29/2015 Physical Examination: CONSTITUTIONAL: Well-developed, well-nourished female in no acute distress.  HENT:  Normocephalic, atraumatic, External right and left ear normal. Oropharynx is clear and moist EYES: Conjunctivae and EOM are normal. Pupils are equal, round, and reactive to light. No scleral icterus.  NECK: Normal range of motion, supple, no masses SKIN: Skin is warm and dry. No rash noted. Not diaphoretic. No erythema. No pallor. NEUROLGIC: Alert and oriented to person, place, and time. Normal reflexes, muscle tone coordination. No cranial nerve deficit noted. PSYCHIATRIC: Normal mood and affect. Normal behavior. Normal judgment and thought content. CARDIOVASCULAR: Normal heart rate noted, regular rhythm RESPIRATORY: Effort and breath sounds normal, no problems with respiration noted ABDOMEN: Soft, nontender, nondistended, gravid. MUSCULOSKELETAL: Normal range of motion. No edema and no tenderness. 2+ distal pulses.  GU: bulging membranes in vagina, no significant discharge Fetal Monitoring: FHT 142 Tocometer: Flat  Labs:  Results for orders placed or performed during  the hospital encounter of 08/16/15 (from the past 24 hour(s))  Urinalysis, Routine w reflex microscopic (not at [facility])   Collection Time: 08/16/15  8:10 PM  Result Value Ref Range   Color, Urine YELLOW YELLOW   APPearance CLEAR CLEAR   Specific Gravity, Urine 1.010 1.005 - 1.030   pH 5.5 5.0 - 8.0   Glucose, UA NEGATIVE NEGATIVE mg/dL   Hgb urine dipstick TRACE (A) NEGATIVE   Bilirubin Urine NEGATIVE NEGATIVE   Ketones, ur NEGATIVE NEGATIVE mg/dL   Protein, ur NEGATIVE NEGATIVE  mg/dL   Nitrite NEGATIVE NEGATIVE   Leukocytes, UA MODERATE (A) NEGATIVE  Urine microscopic-add on   Collection Time: 08/16/15  8:10 PM  Result Value Ref Range   Squamous Epithelial / LPF 0-5 (A) NONE SEEN   WBC, UA 6-30 0 - 5 WBC/hpf   RBC / HPF NONE SEEN 0 - 5 RBC/hpf   Bacteria, UA FEW (A) NONE SEEN   Urine-Other TRICHOMONAS PRESENT     Imaging Studies: [place] [facility] Ob Comp + 14 Wk  08-Sep-2015  OBSTETRICAL ULTRASOUND: This exam was performed within a [facility] [facility]. The OB US report was generated in the [facility] system, and faxed to the ordering physician.  This report is available in the [employer]. See the [facility] Obstetric US report via the Image Link.    Assessment and Plan: Patient Active Problem List   Diagnosis Date Noted  . Cervical insufficiency during pregnancy in second trimester, antepartum 08/16/2015  . STD (sexually transmitted disease) complicating pregnancy, antepartum 07/28/2015  . Supervision of normal first pregnancy 07/25/2015   Admit to antenatal F/u results of u/s, considering rescue cerclage F/u g/c, wet prep, and GBS Bed rest, trandelenburg   Silvano Bilis, MD OB fellow Faculty Practice, [facility]

## 2015-08-17 ENCOUNTER — Encounter (HOSPITAL_COMMUNITY): Payer: Self-pay | Admitting: *Deleted

## 2015-08-17 DIAGNOSIS — A599 Trichomoniasis, unspecified: Secondary | ICD-10-CM | POA: Diagnosis present

## 2015-08-17 DIAGNOSIS — O98219 Gonorrhea complicating pregnancy, unspecified trimester: Secondary | ICD-10-CM | POA: Diagnosis present

## 2015-08-17 DIAGNOSIS — O98819 Other maternal infectious and parasitic diseases complicating pregnancy, unspecified trimester: Secondary | ICD-10-CM

## 2015-08-17 DIAGNOSIS — A749 Chlamydial infection, unspecified: Secondary | ICD-10-CM | POA: Diagnosis present

## 2015-08-17 DIAGNOSIS — IMO0002 Reserved for concepts with insufficient information to code with codable children: Secondary | ICD-10-CM | POA: Diagnosis not present

## 2015-08-17 LAB — GC/CHLAMYDIA PROBE AMP (~~LOC~~) NOT AT ARMC
CHLAMYDIA, DNA PROBE: NEGATIVE
NEISSERIA GONORRHEA: NEGATIVE

## 2015-08-17 LAB — ABO/RH: ABO/RH(D): B POS

## 2015-08-17 LAB — RPR: RPR Ser Ql: NONREACTIVE

## 2015-08-17 MED ORDER — TETANUS-DIPHTH-ACELL PERTUSSIS 5-2.5-18.5 LF-MCG/0.5 IM SUSP: 0.5000 mL | Freq: Once | INTRAMUSCULAR | Status: DC

## 2015-08-17 MED ORDER — SENNOSIDES-DOCUSATE SODIUM 8.6-50 MG PO TABS: 2.0000 | ORAL_TABLET | ORAL | Status: DC

## 2015-08-17 MED ORDER — BENZOCAINE-MENTHOL 20-0.5 % EX AERO: 1.0000 "application " | INHALATION_SPRAY | CUTANEOUS | Status: DC | PRN

## 2015-08-17 MED ORDER — WITCH HAZEL-GLYCERIN EX PADS: 1.0000 "application " | MEDICATED_PAD | CUTANEOUS | Status: DC | PRN

## 2015-08-17 MED ORDER — DIPHENHYDRAMINE HCL 25 MG PO CAPS: 25.0000 mg | ORAL_CAPSULE | Freq: Four times a day (QID) | ORAL | Status: DC | PRN

## 2015-08-17 MED ORDER — COCONUT OIL OIL
1.0000 "application " | TOPICAL_OIL | Status: DC | PRN
  Filled 2015-08-17: qty 120

## 2015-08-17 MED ORDER — SODIUM CHLORIDE 0.9 % IV SOLN
INTRAVENOUS | Status: DC
  Administered 2015-08-17: 01:00:00 via INTRAVENOUS

## 2015-08-17 MED ORDER — ONDANSETRON HCL 4 MG/2ML IJ SOLN
4.0000 mg | INTRAMUSCULAR | Status: DC | PRN
  Administered 2015-08-17: 4 mg via INTRAVENOUS
  Filled 2015-08-17: qty 2

## 2015-08-17 MED ORDER — OXYTOCIN 10 UNIT/ML IJ SOLN
INTRAVENOUS | Status: AC
  Administered 2015-08-17: 04:00:00
  Administered 2015-08-17: 125 mL/h
  Filled 2015-08-17: qty 4

## 2015-08-17 MED ORDER — IBUPROFEN 600 MG PO TABS
600.0000 mg | ORAL_TABLET | Freq: Four times a day (QID) | ORAL | Status: DC
  Administered 2015-08-17: 600 mg via ORAL
  Filled 2015-08-17: qty 1

## 2015-08-17 MED ORDER — ONDANSETRON HCL 4 MG PO TABS: 4.0000 mg | ORAL_TABLET | ORAL | Status: DC | PRN

## 2015-08-17 MED ORDER — ACETAMINOPHEN 325 MG PO TABS: 650.0000 mg | ORAL_TABLET | ORAL | Status: DC | PRN

## 2015-08-17 MED ORDER — DOXYCYCLINE HYCLATE 100 MG PO CAPS: 100.0000 mg | ORAL_CAPSULE | Freq: Two times a day (BID) | ORAL | Status: DC

## 2015-08-17 MED ORDER — ZOLPIDEM TARTRATE 5 MG PO TABS: 5.0000 mg | ORAL_TABLET | Freq: Every evening | ORAL | Status: DC | PRN

## 2015-08-17 MED ORDER — FENTANYL CITRATE (PF) 100 MCG/2ML IJ SOLN
100.0000 ug | INTRAMUSCULAR | Status: DC | PRN
  Administered 2015-08-17 (×2): 100 ug via INTRAVENOUS
  Filled 2015-08-17 (×2): qty 2

## 2015-08-17 MED ORDER — ONDANSETRON HCL 4 MG/2ML IJ SOLN: 4.0000 mg | Freq: Four times a day (QID) | INTRAMUSCULAR | Status: DC

## 2015-08-17 MED ORDER — SIMETHICONE 80 MG PO CHEW: 80.0000 mg | CHEWABLE_TABLET | ORAL | Status: DC | PRN

## 2015-08-17 MED ORDER — PRENATAL MULTIVITAMIN CH
1.0000 | ORAL_TABLET | Freq: Every day | ORAL | Status: DC
Start: 2015-08-17 — End: 2015-08-17

## 2015-08-17 MED ORDER — METRONIDAZOLE IN NACL 5-0.79 MG/ML-% IV SOLN
500.0000 mg | Freq: Three times a day (TID) | INTRAVENOUS | Status: DC
  Administered 2015-08-17: 500 mg via INTRAVENOUS
  Filled 2015-08-17 (×2): qty 100

## 2015-08-17 MED ORDER — DEXTROSE 5 % IV SOLN
2.0000 g | INTRAVENOUS | Status: DC
  Administered 2015-08-17: 2 g via INTRAVENOUS
  Filled 2015-08-17: qty 2

## 2015-08-17 MED ORDER — DIBUCAINE 1 % RE OINT: 1.0000 "application " | TOPICAL_OINTMENT | RECTAL | Status: DC | PRN

## 2015-08-17 MED ORDER — IBUPROFEN 600 MG PO TABS: 600.0000 mg | ORAL_TABLET | Freq: Four times a day (QID) | ORAL | Status: DC

## 2015-08-17 NOTE — Discharge Instructions (Signed)
Postpartum Depression and Baby Blues °The postpartum period begins right after the birth of a baby. During this time, there is often a great amount of joy and excitement. It is also a time of many changes in the life of the parents. Regardless of how many times a mother gives birth, each child brings new challenges and dynamics to the family. It is not unusual to have feelings of excitement along with confusing shifts in moods, emotions, and thoughts. All mothers are at risk of developing postpartum depression or the "baby blues." These mood changes can occur right after giving birth, or they may occur many months after giving birth. The baby blues or postpartum depression can be mild or severe. Additionally, postpartum depression can go away rather quickly, or it can be a long-term condition.  °CAUSES °Raised hormone levels and the rapid drop in those levels are thought to be a main cause of postpartum depression and the baby blues. A number of hormones change during and after pregnancy. Estrogen and progesterone usually decrease right after the delivery of your baby. The levels of thyroid hormone and various cortisol steroids also rapidly drop. Other factors that play a role in these mood changes include major life events and genetics.  °RISK FACTORS °If you have any of the following risks for the baby blues or postpartum depression, know what symptoms to watch out for during the postpartum period. Risk factors that may increase the likelihood of getting the baby blues or postpartum depression include: °· Having a personal or family history of depression.   °· Having depression while being pregnant.   °· Having premenstrual mood issues or mood issues related to oral contraceptives. °· Having a lot of life stress.   °· Having marital conflict.   °· Lacking a social support network.   °· Having a baby with special needs.   °· Having health problems, such as diabetes.   °SIGNS AND SYMPTOMS °Symptoms of baby blues  include: °· Brief changes in mood, such as going from extreme happiness to sadness. °· Decreased concentration.   °· Difficulty sleeping.   °· Crying spells, tearfulness.   °· Irritability.   °· Anxiety.   °Symptoms of postpartum depression typically begin within the first month after giving birth. These symptoms include: °· Difficulty sleeping or excessive sleepiness.   °· Marked weight loss.   °· Agitation.   °· Feelings of worthlessness.   °· Lack of interest in activity or food.   °Postpartum psychosis is a very serious condition and can be dangerous. Fortunately, it is rare. Displaying any of the following symptoms is cause for immediate medical attention. Symptoms of postpartum psychosis include:  °· Hallucinations and delusions.   °· Bizarre or disorganized behavior.   °· Confusion or disorientation.   °DIAGNOSIS  °A diagnosis is made by an evaluation of your symptoms. There are no medical or lab tests that lead to a diagnosis, but there are various questionnaires that a health care provider may use to identify those with the baby blues, postpartum depression, or psychosis. Often, a screening tool called the Edinburgh Postnatal Depression Scale is used to diagnose depression in the postpartum period.  °TREATMENT °The baby blues usually goes away on its own in 1-2 weeks. Social support is often all that is needed. You will be encouraged to get adequate sleep and rest. Occasionally, you may be given medicines to help you sleep.  °Postpartum depression requires treatment because it can last several months or longer if it is not treated. Treatment may include individual or group therapy, medicine, or both to address any social, physiological, and psychological   factors that may play a role in the depression. Regular exercise, a healthy diet, rest, and social support may also be strongly recommended.  °Postpartum psychosis is more serious and needs treatment right away. Hospitalization is often needed. °HOME CARE  INSTRUCTIONS °· Get as much rest as you can. Nap when the baby sleeps.   °· Exercise regularly. Some women find yoga and walking to be beneficial.   °· Eat a balanced and nourishing diet.   °· Do little things that you enjoy. Have a cup of tea, take a bubble bath, read your favorite magazine, or listen to your favorite music. °· Avoid alcohol.   °· Ask for help with household chores, cooking, grocery shopping, or running errands as needed. Do not try to do everything.   °· Talk to people close to you about how you are feeling. Get support from your partner, family members, friends, or other new moms. °· Try to stay positive in how you think. Think about the things you are grateful for.   °· Do not spend a lot of time alone.   °· Only take over-the-counter or prescription medicine as directed by your health care provider. °· Keep all your postpartum appointments.   °· Let your health care provider know if you have any concerns.   °SEEK MEDICAL CARE IF: °You are having a reaction to or problems with your medicine. °SEEK IMMEDIATE MEDICAL CARE IF: °· You have suicidal feelings.   °· You think you may harm the baby or someone else. °MAKE SURE YOU: °· Understand these instructions. °· Will watch your condition. °· Will get help right away if you are not doing well or get worse. °  °This information is not intended to replace advice given to you by your health care provider. Make sure you discuss any questions you have with your health care provider. °  °Document Released: 01/25/2004 Document Revised: 04/27/2013 Document Reviewed: 02/01/2013 °Elsevier Interactive Patient Education ©2016 Elsevier Inc. ° °

## 2015-08-17 NOTE — Progress Notes (Signed)
[facility] and [facility] mother upon the loss of her 5820 week old daughter.  She reports that everything happened very suddenly early this morning and that she is still in a state of shock and grief but knows she'll be okay.  I normalized her feelings and provided some education on the variety of emotions individuals go through and the length of time it can take to feel remotely normal again.  We discussed resources for support including [employer], which she and her mother used for support when her brother died a few years ago.  She stated she would definitely check out [employer] because they'd been so helpful to her in the past.  We also discussed the ongoing support available by hospital chaplains and the comfort team.  The visit concluded with prayer at the family's request.  THey shared that they were ready to be discharged and I communicated this information with nursing staff.  Please page as further needs arise.  Maryanna ShapeAmanda M. Carley Hammedavee Lomax, M.Div. [facility] Chaplain Pager [phone] Office [phone]

## 2015-08-17 NOTE — Progress Notes (Signed)
Catherine Hayes is a 20 y.o. G1P0 at [redacted]w[redacted]d by ultrasound admitted for Preterm labor  Subjective: Called to see pt due to the development of regular uterine contractions and significant lower abdominal discomfort.   Objective: BP 119/56 mmHg  Pulse 76  Temp(Src) 98.8 F (37.1 C) (Oral)  Resp 18  Ht 5\' 5"  (1.651 m)  Wt 60.328 kg (133 lb)  BMI 22.13 kg/m2  SpO2 100%  LMP 03/29/2015  Review of admit labs show trichomonas in urine as well as mild leukocytosis with left shift. Recently treated for GC and Chlamydia with no sexual activity since treatment.     FHT:  160's UC:   regular, every 3 minutes SVE:     None qat this time Labs: Lab Results  Component Value Date   WBC 14.8* 08/16/2015   HGB 10.5* 08/16/2015   HCT 30.3* 08/16/2015   MCV 89.6 08/16/2015   PLT 197 08/16/2015    Assessment / Plan: pt now behaving like Intraamniotic infection, so tocolysis not indicated .    Labor:  Preeclampsia:  none Fetal Wellbeing ; previable Pain Control:  fentanyl iv I/D:  n/a Anticipated MOD:  anticipate pt progressing to preterm immature fetus delivery.  pt aware that prognosis for stopping labor is poor, will give supportive care with IV rocephin and Flagyl, and Fentanyl for pain.  Catherine Hayes V 08/17/2015, 12:39 AM

## 2015-08-17 NOTE — Discharge Summary (Signed)
OB Discharge Summary     Patient Name: Catherine Hayes DOB: 17-Aug-1995 MRN: [id number]  Date of admission: 08/16/2015 Delivering MD: Tilda Burrow   Date of discharge: 08/17/2015  Admitting diagnosis: 20WKS PAIN IN LOWER STOMACH, SOMETHING FEELS LIKE ITS COMING OUT OF MY VAGINA Intrauterine pregnancy: [redacted]w[redacted]d     Secondary diagnosis:  Principal Problem:   NSVD (normal spontaneous vaginal delivery) Active Problems:   Supervision of normal first pregnancy   STD (sexually transmitted disease) complicating pregnancy, antepartum   Cervical insufficiency during pregnancy in second trimester, antepartum   Cervical insufficiency in pregnancy, antepartum   Preterm labor with preterm delivery   Trichimoniasis   Gonorrhea affecting pregnancy   Chlamydia infection affecting pregnancy   Stillbirth of single fetus  Additional problems: none     Discharge diagnosis: Previable preterm delivery                                                                                                Post partum procedures:none  Augmentation: Pitocin  Complications: None  Hospital course:  Onset of Labor With Vaginal Delivery     20 y.o. yo G1P1 at [redacted]w[redacted]d was admitted in Latent Labor on 08/16/2015. Patient had an uncomplicated labor course as follows:  Membrane Rupture Time/Date: 2:18 AM ,08/17/2015   Intrapartum Procedures: Episiotomy: None [1]                                         Lacerations:  None [1]  Patient had a delivery of a Non Viable infant. 08/17/2015  Information for the patient's newborn:  Giuliana, [facility] [[id number]]  Delivery Method: Vaginal, Spontaneous Delivery (Filed from Delivery Summary)  Pateint had an uncomplicated postpartum course. She declined autopsy on the fetus.  She is s/p treatment for Gonorrhea, chlamydia and trichamonas. She is ambulating, tolerating a regular diet, passing flatus, and urinating well. Patient is discharged home in stable condition on 08/17/2015. She  will complete a doxycycline course.    Physical exam  Filed Vitals:   08/17/15 0705 08/17/15 0802 08/17/15 0842 08/17/15 0843  BP: 102/44 97/47  110/54  Pulse: 72 73 82 103  Temp:      TempSrc:      Resp:   18   Height:      Weight:      SpO2:   99%    General: alert, cooperative and no distress. Tearful Lochia: appropriate Uterine Fundus: firm Incision: N/A DVT Evaluation: No evidence of DVT seen on physical exam. Labs: Lab Results  Component Value Date   WBC 14.8* 08/16/2015   HGB 10.5* 08/16/2015   HCT 30.3* 08/16/2015   MCV 89.6 08/16/2015   PLT 197 08/16/2015   CMP Latest Ref Rng 08/03/2012  Glucose 70 - 99 mg/dL 086(V)  BUN 6 - 23 mg/dL -  Creatinine 7.84 - 6.96 mg/dL -  Sodium 295 - 284 mEq/L 135  Potassium 3.5 - 5.1 mEq/L 3.8  Chloride 96 - 112 mEq/L -  CO2 19 - 32  mEq/L -  Calcium 8.4 - 10.5 mg/dL -  Total Protein 6.0 - 8.3 g/dL -  Total Bilirubin 0.3 - 1.2 mg/dL -  Alkaline Phos 47 - 960119 U/L -  AST 0 - 37 U/L -  ALT 0 - 35 U/L -    Discharge instruction: per After Visit Summary and "Baby and Me Booklet".  After visit meds:    Medication List    TAKE these medications        acetaminophen 500 MG tablet  Commonly known as:  TYLENOL  Take 500 mg by mouth every 6 (six) hours as needed for headache.     doxycycline 100 MG capsule  Commonly known as:  VIBRAMYCIN  Take 1 capsule (100 mg total) by mouth 2 (two) times daily.     ibuprofen 600 MG tablet  Commonly known as:  ADVIL,MOTRIN  Take 1 tablet (600 mg total) by mouth every 6 (six) hours.     PRENATAL VITAMINS PLUS 27-1 MG Tabs  Take 1 tablet by mouth daily.        Diet: routine diet  Activity: Advance as tolerated. Pelvic rest for 6 weeks.   Outpatient follow up:6 weeks Future Appointments Date Time Provider Department Center  09/18/2015 1:15 PM Catalina AntiguaPeggy Constant, MD [facility]    Postpartum contraception: Not Discussed  Newborn Data: Stillborn born female  Birth Weight: 9.9 oz  (280 g) APGAR: 0, 0  Baby Feeding: na Disposition:morgue  08/17/2015 Federico FlakeKimberly Niles Marrion Accomando, MD  Discussed with attending physician Jaynie CollinsUgonna Anyanwu MD

## 2015-08-17 NOTE — Progress Notes (Signed)
Catherine Hayes is a 20 y.o. G1P0 at 2244w1d by  admitted for Preterm labor, chorioamnionitis.   Subjective: The pt has developed ROM with malodorous fluid. Contractions regular   Objective: BP 122/58 mmHg  Pulse 88  Temp(Src) 98.6 F (37 C) (Oral)  Resp 18  Ht 5\' 5"  (1.651 m)  Wt 60.328 kg (133 lb)  BMI 22.13 kg/m2  SpO2 97%  LMP 03/29/2015      FHT:  Unable to hear fht's since ROM UC:   regular, every 3 minutes SVE:   Dilation: 10 Station: +2 Exam by:: Lucas MallowKlashley, RN  Labs: Lab Results  Component Value Date   WBC 14.8* 08/16/2015   HGB 10.5* 08/16/2015   HCT 30.3* 08/16/2015   MCV 89.6 08/16/2015   PLT 197 08/16/2015    Assessment / Plan: pregnancy 20+1 wk, PPROM, previable , chorioamnionitis, laboring actively  Labor: expect pt to progress to delivery Preeclampsia:   Fetal Wellbeing:  unable to confirm fht, previable fetus Pain Control:  fentanyl I/D:  n/a Anticipated MOD:  NSVD  Catherine Hayes V 08/17/2015, 3:41 AM

## 2015-08-17 NOTE — Progress Notes (Signed)
Patient ID: [name], female   DOB: 04-19-1996, 20 y.o.   MRN: [id number] Pt has had a long greiving process, alternately lashes out, has run mother out, is dissatisfied with my most recent interaction.  Pt allowed to push spontaneously , was unable to expel fetus, so pt agreed to offered pitocin, and has now progressed to delivery Pt progress to spontaneous vertex delivery of immature previable stillborn female fetus , apgars 0,0, with EBL 200 cc, Placenta expelled in approx 5 minutes. Baby swaddled and pt expressed interest in seeing the baby. [name]'s mother in attendance. Placenta intact duncan presentation, spont expelled. To be sent to pathology. Pt to be observed til the a.m , and then may be discharged.

## 2015-08-18 LAB — CULTURE, OB URINE

## 2015-08-19 LAB — CULTURE, BETA STREP (GROUP B ONLY)

## 2015-08-22 ENCOUNTER — Encounter: Payer: Self-pay | Admitting: Student

## 2015-09-18 ENCOUNTER — Ambulatory Visit: Payer: Medicaid Other | Admitting: Obstetrics and Gynecology

## 2016-03-14 ENCOUNTER — Emergency Department (HOSPITAL_COMMUNITY)
Admission: EM | Admit: 2016-03-14 | Discharge: 2016-03-14 | Disposition: A | Discharge status: Home / Self Care | Payer: Medicaid Other | Attending: Emergency Medicine | Admitting: Emergency Medicine

## 2016-03-14 ENCOUNTER — Encounter (HOSPITAL_COMMUNITY): Payer: Self-pay

## 2016-03-14 ENCOUNTER — Emergency Department (HOSPITAL_COMMUNITY): Payer: Medicaid Other

## 2016-03-14 DIAGNOSIS — R0789 Other chest pain: Secondary | ICD-10-CM | POA: Insufficient documentation

## 2016-03-14 DIAGNOSIS — R079 Chest pain, unspecified: Secondary | ICD-10-CM | POA: Diagnosis present

## 2016-03-14 DIAGNOSIS — F17210 Nicotine dependence, cigarettes, uncomplicated: Secondary | ICD-10-CM | POA: Insufficient documentation

## 2016-03-14 LAB — BASIC METABOLIC PANEL
Anion gap: 8 (ref 5–15)
BUN: 15 mg/dL (ref 6–20)
CHLORIDE: 103 mmol/L (ref 101–111)
CO2: 25 mmol/L (ref 22–32)
Calcium: 9.5 mg/dL (ref 8.9–10.3)
Creatinine, Ser: 0.7 mg/dL (ref 0.44–1.00)
Glucose, Bld: 93 mg/dL (ref 65–99)
POTASSIUM: 3.9 mmol/L (ref 3.5–5.1)
SODIUM: 136 mmol/L (ref 135–145)

## 2016-03-14 LAB — CBC
HEMATOCRIT: 37.9 % (ref 36.0–46.0)
Hemoglobin: 13.2 g/dL (ref 12.0–15.0)
MCH: 30.1 pg (ref 26.0–34.0)
MCHC: 34.8 g/dL (ref 30.0–36.0)
MCV: 86.3 fL (ref 78.0–100.0)
PLATELETS: 242 10*3/uL (ref 150–400)
RBC: 4.39 MIL/uL (ref 3.87–5.11)
RDW: 13.8 % (ref 11.5–15.5)
WBC: 7 10*3/uL (ref 4.0–10.5)

## 2016-03-14 LAB — I-STAT TROPONIN, ED: Troponin i, poc: 0.02 ng/mL (ref 0.00–0.08)

## 2016-03-14 MED ORDER — IBUPROFEN 600 MG PO TABS: 600.0000 mg | ORAL_TABLET | Freq: Four times a day (QID) | ORAL | 0 refills | Status: DC

## 2016-03-14 NOTE — ED Triage Notes (Addendum)
Pt states for 4 days she has had upper left chest pain.  Pain improves with massage and worse with movement.  Pt does not recall lifting anything heavy to cause injury.  No cough.  No reflux symptoms.  Pt states hurts intermittent.  Worse with lying down and moving a certain way.

## 2016-03-14 NOTE — ED Provider Notes (Signed)
[facility]-EMERGENCY DEPT Provider Note   CSN: [id number] Arrival date & time: 03/14/16  1622     History   Chief Complaint Chief Complaint  Patient presents with  . Chest Pain    HPI Catherine Hayes is a 20 y.o. female.  Patient is 20 yo F with no personal or family history of heart disease, presenting with chief complaint of upper left chest pain x 4 days. Pain has been intermittent and worse with moving her left arm or "sleeping in the wrong position." She has tried nothing for relief. Patient denies any recent trauma to chest or strenuous exercise. She denies any fevers, cough, shortness of breath, or palpitations. Denies exogenous estrogen use, lower extremity pain or swelling, recent travel/immobilization, history of PE/DVT, or history of bleeding/clotting disorders. She occasionally smokes cigarettes, but denies any alcohol or drug use.      History reviewed. No pertinent past medical history.  Patient Active Problem List   Diagnosis Date Noted  . NSVD (normal spontaneous vaginal delivery) 08/17/2015  . Preterm labor with preterm delivery 08/17/2015  . Trichimoniasis 08/17/2015  . Gonorrhea affecting pregnancy 08/17/2015  . Chlamydia infection affecting pregnancy 08/17/2015  . Stillbirth of single fetus 08/17/2015  . Cervical insufficiency during pregnancy in second trimester, antepartum 08/16/2015  . Cervical insufficiency in pregnancy, antepartum 08/16/2015  . STD (sexually transmitted disease) complicating pregnancy, antepartum 07/28/2015  . Supervision of normal first pregnancy 07/25/2015    Past Surgical History:  Procedure Laterality Date  . ANTERIOR LAT LUMBAR FUSION N/A 08/03/2012   Procedure: ANTERIOR LATERAL LUMBAR L1 CORPECTOMY 1 LEVEL;  Surgeon: Tia Alert, MD;  Location: [facility];  Service: Neurosurgery;  Laterality: N/A;  Anteriolateral lumbar one corpectomy, strut graft and plating  . WRIST SURGERY      OB History    Gravida Para Term Preterm AB  Living   1 1       0   SAB TAB Ectopic Multiple Live Births         0         Home Medications    Prior to Admission medications   Medication Sig Start Date End Date Taking? Authorizing Provider  acetaminophen (TYLENOL) 500 MG tablet Take 1,000 mg by mouth every 6 (six) hours as needed for headache.    Yes Historical Provider, MD  doxycycline (VIBRAMYCIN) 100 MG capsule Take 1 capsule (100 mg total) by mouth 2 (two) times daily. Patient not taking: Reported on 03/14/2016 08/17/15   Tereso Newcomer, MD  ibuprofen (ADVIL,MOTRIN) 600 MG tablet Take 1 tablet (600 mg total) by mouth every 6 (six) hours. Patient not taking: Reported on 03/14/2016 08/17/15   Federico Flake, MD    Family History Family History  Problem Relation Age of Onset  . Diabetes Mother   . Hypertension Mother   . Diabetes Maternal Grandfather     Social History Social History  Substance Use Topics  . Smoking status: Current Some Day Smoker    Types: Cigarettes  . Smokeless tobacco: Never Used  . Alcohol use No     Allergies   Patient has no known allergies.   Review of Systems Review of Systems  Constitutional: Negative for chills and fever.  HENT: Negative for ear pain and sore throat.   Eyes: Negative for pain and visual disturbance.  Respiratory: Negative for cough and shortness of breath.   Cardiovascular: Positive for chest pain. Negative for palpitations and leg swelling.  Gastrointestinal: Negative for abdominal pain,  blood in stool, nausea and vomiting.  Genitourinary: Negative for dysuria, flank pain and hematuria.  Musculoskeletal: Negative for back pain and neck pain.  Skin: Negative for color change and rash.  Neurological: Negative for dizziness, seizures, syncope, weakness, numbness and headaches.     Physical Exam Updated Vital Signs BP 129/76 (BP Location: Left Arm)   Pulse 65   Temp 98.4 F (36.9 C) (Oral)   Resp 18   Ht 5\' 6"  (1.676 m)   Wt 55.8 kg   LMP 02/22/2016    SpO2 100%   BMI 19.85 kg/m   Physical Exam  Constitutional: She appears well-developed and well-nourished. No distress.  HENT:  Head: Normocephalic and atraumatic.  Mouth/Throat: Oropharynx is clear and moist.  Eyes: Conjunctivae are normal.  Neck: Normal range of motion.  Cardiovascular: Normal rate, regular rhythm, normal heart sounds and intact distal pulses.   Pulmonary/Chest: Effort normal and breath sounds normal. No respiratory distress. She exhibits no tenderness.  No TTP of ribs, thoracic spine, or chest wall.  Abdominal: Soft. There is no tenderness.  Musculoskeletal: Normal range of motion. She exhibits no edema or tenderness.  Neurological: She is alert.  Skin: Skin is warm and dry.  Psychiatric: She has a normal mood and affect.  Nursing note and vitals reviewed.    ED Treatments / Results  Labs (all labs ordered are listed, but only abnormal results are displayed) Labs Reviewed  BASIC METABOLIC PANEL  CBC  I-STAT TROPOININ, ED    EKG  EKG Interpretation  Date/Time:  Thursday March 14 2016 16:44:07 EST Ventricular Rate:  69 PR Interval:    QRS Duration: 86 QT Interval:  376 QTC Calculation: 403 R Axis:   87 Text Interpretation:  Sinus rhythm Consider left atrial enlargement No acute changes No old tracing to compare Confirmed by Rhunette CroftNANAVATI, MD, Janey GentaANKIT [phone]([id number]) on 03/14/2016 8:29:46 PM       Radiology Dg Chest 2 View  Result Date: 03/14/2016 CLINICAL DATA:  Chest pain for 4 days with shortness of breath EXAM: CHEST  2 VIEW COMPARISON:  08/02/2012 FINDINGS: No focal pulmonary infiltrate, consolidation or pleural effusion. Normal cardiomediastinal silhouette. No pneumothorax. Postsurgical changes at T12 and L1. IMPRESSION: No active cardiopulmonary disease. Electronically Signed   By: Jasmine PangKim  Fujinaga M.D.   On: 03/14/2016 17:40    Procedures Procedures (including critical care time)  Medications Ordered in ED Medications - No data to  display   Initial Impression / Assessment and Plan / ED Course  I have reviewed the triage vital signs and the nursing notes.  Pertinent labs & imaging results that were available during my care of the patient were reviewed by me and considered in my medical decision making (see chart for details).  Clinical Course    Patient is 20 yo F with no personal or family history of heart disease, presenting with chief complaint of upper left chest pain. Made worse with moving her left arm or "sleeping in the wrong position." EKG NSR and troponin 0.02 WNL. CXR no acute cardiopulmonary disease. CBC and BMP normal. PERC negative safely r/o PE. Likely musculoskeletal pain. Patient requesting d/c home with no further testing. Given prescription for ibuprofen and referral [facility]. Return precautions discussed for any new or worsening symptoms.   Final Clinical Impressions(s) / ED Diagnoses   Final diagnoses:  Atypical chest pain    New Prescriptions New Prescriptions   No medications on file     Dalissa Lovin  Warnell BureauF de HettingerVillier II, PA 03/15/16 1752    Derwood KaplanAnkit Nanavati, MD 03/16/16 [id number]

## 2016-03-14 NOTE — Discharge Instructions (Signed)
All testing is negative for any acute cause of your chest pain, and you likely have a muscle strain in your chest. Please take the prescription ibuprofen as needed for relief of the pain, and call [facility] to establish care with PCP for reevaluation.

## 2016-03-14 NOTE — Progress Notes (Signed)
Patient listed as having Medicaid insurance.  [facility] on [facility]'s [facility] card [facility]  Patient confirms this is her pcp, she just hasn't gone there.  [facility] to schedule follow up appointment and establish care with her pcp as soon as possible.  Patient verbalized understanding.  No further EDCM needs at this time.

## 2016-03-14 NOTE — ED Notes (Signed)
Writer spoke with Dr. Clydene PughKnott.  Explained patient symptoms.  Symptoms appear musculoskeletal but orders given to initiate chest pain protocol to r/o other complications.

## 2016-05-11 ENCOUNTER — Emergency Department (HOSPITAL_COMMUNITY)
Admission: EM | Admit: 2016-05-11 | Discharge: 2016-05-11 | Disposition: A | Discharge status: Home / Self Care | Payer: Medicaid Other | Attending: Emergency Medicine | Admitting: Emergency Medicine

## 2016-05-11 ENCOUNTER — Encounter (HOSPITAL_COMMUNITY): Payer: Self-pay | Admitting: Emergency Medicine

## 2016-05-11 DIAGNOSIS — F17210 Nicotine dependence, cigarettes, uncomplicated: Secondary | ICD-10-CM | POA: Insufficient documentation

## 2016-05-11 DIAGNOSIS — J0100 Acute maxillary sinusitis, unspecified: Secondary | ICD-10-CM

## 2016-05-11 DIAGNOSIS — R0981 Nasal congestion: Secondary | ICD-10-CM | POA: Diagnosis present

## 2016-05-11 MED ORDER — AMOXICILLIN-POT CLAVULANATE 875-125 MG PO TABS: 1.0000 | ORAL_TABLET | Freq: Two times a day (BID) | ORAL | 0 refills | Status: DC

## 2016-05-11 NOTE — ED Notes (Signed)
Pt tolerating PO fluids well

## 2016-05-11 NOTE — Discharge Instructions (Signed)
Drink plenty of fluids Take antibiotic for 5 days. Take with food Use Flonase which is over the counter for nasal congestion and swelling.

## 2016-05-11 NOTE — ED Provider Notes (Signed)
[facility]-EMERGENCY DEPT Provider Note   CSN: [id number] Arrival date & time: 05/11/16  1647     History   Chief Complaint Chief Complaint  Patient presents with  . Nasal Congestion    HPI Catherine Hayes is a 21 y.o. female who presents with sinus pressure. No significant PMH. She states that she first got sick with the same symptoms two weeks ago. She felt sick for about a week and a half and then her symptoms got better for about 2-3 days and then returned. She has been experiencing these symptoms for the past 5 days. She reports sinus pressure, chills, lightheadedness, nasal congestion, rhinorrhea, post-nasal drip, right ear pressure, mild cough, mild nausea. Also has had some diarrhea for the past 2 days. She denies fever, headache, neck pain, sore throat, chest pain, SOB, wheezing, abdominal pain, vomiting, dysuria. She has been taking Nyquil and using nasal saline with mild relief of symptoms. Denies sick contacts. Has not had her flu shot this year.  HPI  History reviewed. No pertinent past medical history.  Patient Active Problem List   Diagnosis Date Noted  . NSVD (normal spontaneous vaginal delivery) 08/17/2015  . Preterm labor with preterm delivery 08/17/2015  . Trichimoniasis 08/17/2015  . Gonorrhea affecting pregnancy 08/17/2015  . Chlamydia infection affecting pregnancy 08/17/2015  . Stillbirth of single fetus 08/17/2015  . Cervical insufficiency during pregnancy in second trimester, antepartum 08/16/2015  . Cervical insufficiency in pregnancy, antepartum 08/16/2015  . STD (sexually transmitted disease) complicating pregnancy, antepartum 07/28/2015  . Supervision of normal first pregnancy 07/25/2015    Past Surgical History:  Procedure Laterality Date  . ANTERIOR LAT LUMBAR FUSION N/A 08/03/2012   Procedure: ANTERIOR LATERAL LUMBAR L1 CORPECTOMY 1 LEVEL;  Surgeon: Tia Alertavid S Jones, MD;  Location: [facility];  Service: Neurosurgery;  Laterality: N/A;  Anteriolateral lumbar  one corpectomy, strut graft and plating  . WRIST SURGERY      OB History    Gravida Para Term Preterm AB Living   1 1       0   SAB TAB Ectopic Multiple Live Births         0         Home Medications    Prior to Admission medications   Medication Sig Start Date End Date Taking? Authorizing Provider  acetaminophen (TYLENOL) 500 MG tablet Take 1,000 mg by mouth every 6 (six) hours as needed for headache.     Historical Provider, MD  amoxicillin-clavulanate (AUGMENTIN) 875-125 MG tablet Take 1 tablet by mouth 2 (two) times daily. 05/11/16   Bethel BornKelly Marie Koni Kannan, PA-C  ibuprofen (ADVIL,MOTRIN) 600 MG tablet Take 1 tablet (600 mg total) by mouth every 6 (six) hours. 03/14/16   Daryl F de Villier II, PA    Family History Family History  Problem Relation Age of Onset  . Diabetes Mother   . Hypertension Mother   . Diabetes Maternal Grandfather     Social History Social History  Substance Use Topics  . Smoking status: Current Some Day Smoker    Types: Cigarettes  . Smokeless tobacco: Never Used  . Alcohol use No     Allergies   Patient has no known allergies.   Review of Systems Review of Systems  Constitutional: Positive for appetite change and chills. Negative for fever.  HENT: Positive for congestion, ear pain, postnasal drip, rhinorrhea, sinus pain and sinus pressure. Negative for sore throat.   Respiratory: Positive for cough. Negative for shortness of breath.  Cardiovascular: Negative for chest pain.  Gastrointestinal: Positive for diarrhea and nausea. Negative for abdominal pain and vomiting.  Genitourinary: Negative for dysuria.  Neurological: Positive for headaches. Negative for syncope and light-headedness.     Physical Exam Updated Vital Signs BP 125/81 (BP Location: Left Arm)   Pulse 103   Temp 98.2 F (36.8 C) (Oral)   Resp 18   Ht 5\' 7"  (1.702 m)   Wt 56 kg   LMP 04/28/2016   SpO2 100%   BMI 19.35 kg/m   Physical Exam  Constitutional: She is  oriented to person, place, and time. She appears well-developed and well-nourished. No distress.  HENT:  Head: Normocephalic and atraumatic.  Right Ear: Hearing, tympanic membrane, external ear and ear canal normal.  Left Ear: Hearing, tympanic membrane, external ear and ear canal normal.  Nose: Mucosal edema and rhinorrhea present. Right sinus exhibits maxillary sinus tenderness. Right sinus exhibits no frontal sinus tenderness. Left sinus exhibits maxillary sinus tenderness. Left sinus exhibits no frontal sinus tenderness.  Mouth/Throat: Uvula is midline, oropharynx is clear and moist and mucous membranes are normal.  Mild redness around nose and cheeks. Stuffy voice  Eyes: Conjunctivae are normal. Pupils are equal, round, and reactive to light. Right eye exhibits no discharge. Left eye exhibits no discharge. No scleral icterus.  Neck: Normal range of motion. Neck supple.  Cardiovascular: Normal rate and regular rhythm.  Exam reveals no gallop and no friction rub.   No murmur heard. Pulmonary/Chest: Effort normal and breath sounds normal. No respiratory distress. She has no wheezes. She has no rales. She exhibits no tenderness.  Abdominal: Soft. Bowel sounds are normal. She exhibits no distension and no mass. There is no tenderness. There is no rebound and no guarding. No hernia.  Lymphadenopathy:    She has no cervical adenopathy.  Neurological: She is alert and oriented to person, place, and time.  Skin: Skin is warm and dry.  Psychiatric: She has a normal mood and affect. Her behavior is normal.  Nursing note and vitals reviewed.    ED Treatments / Results  Labs (all labs ordered are listed, but only abnormal results are displayed) Labs Reviewed - No data to display  EKG  EKG Interpretation None       Radiology No results found.  Procedures Procedures (including critical care time)  Medications Ordered in ED Medications - No data to display   Initial Impression /  Assessment and Plan / ED Course  I have reviewed the triage vital signs and the nursing notes.  Pertinent labs & imaging results that were available during my care of the patient were reviewed by me and considered in my medical decision making (see chart for details).  Clinical Course    21 year old female with sinusitis. Although patient is afebrile, she is mildly tachycardic, reports chills, and has history consistent with double sickening. Will treat with Augmentin for 5 days. Advised supportive therapy such as Flonase as well and plenty of fluids. PO challenge in ED tolerated well. Opportunity for questions provided and all questions answered. Return precautions given.   Final Clinical Impressions(s) / ED Diagnoses   Final diagnoses:  Acute maxillary sinusitis, recurrence not specified    New Prescriptions New Prescriptions   AMOXICILLIN-CLAVULANATE (AUGMENTIN) 875-125 MG TABLET    Take 1 tablet by mouth 2 (two) times daily.     Bethel Born, PA-C 05/13/16 2001    Rolan Bucco, MD 05/14/16 [phone]

## 2016-05-11 NOTE — ED Triage Notes (Signed)
Pt complaint of congestion with associated ear and nasal pressure. Pt denies pain. Pt reports start of symptoms 4-5 days ago.

## 2018-05-28 ENCOUNTER — Emergency Department (HOSPITAL_COMMUNITY)
Admission: EM | Admit: 2018-05-28 | Discharge: 2018-05-28 | Disposition: A | Discharge status: Home / Self Care | Payer: Self-pay | Attending: Emergency Medicine | Admitting: Emergency Medicine

## 2018-05-28 ENCOUNTER — Encounter (HOSPITAL_COMMUNITY): Payer: Self-pay

## 2018-05-28 DIAGNOSIS — Z79899 Other long term (current) drug therapy: Secondary | ICD-10-CM | POA: Insufficient documentation

## 2018-05-28 DIAGNOSIS — B379 Candidiasis, unspecified: Secondary | ICD-10-CM

## 2018-05-28 DIAGNOSIS — B9689 Other specified bacterial agents as the cause of diseases classified elsewhere: Secondary | ICD-10-CM

## 2018-05-28 DIAGNOSIS — K625 Hemorrhage of anus and rectum: Secondary | ICD-10-CM

## 2018-05-28 DIAGNOSIS — N760 Acute vaginitis: Secondary | ICD-10-CM | POA: Insufficient documentation

## 2018-05-28 DIAGNOSIS — F17210 Nicotine dependence, cigarettes, uncomplicated: Secondary | ICD-10-CM | POA: Insufficient documentation

## 2018-05-28 LAB — CBC
HCT: 38.6 % (ref 36.0–46.0)
Hemoglobin: 12.7 g/dL (ref 12.0–15.0)
MCH: 31.8 pg (ref 26.0–34.0)
MCHC: 32.9 g/dL (ref 30.0–36.0)
MCV: 96.5 fL (ref 80.0–100.0)
Platelets: 271 10*3/uL (ref 150–400)
RBC: 4 MIL/uL (ref 3.87–5.11)
RDW: 12.6 % (ref 11.5–15.5)
WBC: 8.7 10*3/uL (ref 4.0–10.5)
nRBC: 0 % (ref 0.0–0.2)

## 2018-05-28 LAB — COMPREHENSIVE METABOLIC PANEL
ALT: 11 U/L (ref 0–44)
AST: 14 U/L — ABNORMAL LOW (ref 15–41)
Albumin: 4.4 g/dL (ref 3.5–5.0)
Alkaline Phosphatase: 58 U/L (ref 38–126)
Anion gap: 6 (ref 5–15)
BILIRUBIN TOTAL: 0.9 mg/dL (ref 0.3–1.2)
BUN: 15 mg/dL (ref 6–20)
CO2: 25 mmol/L (ref 22–32)
Calcium: 8.8 mg/dL — ABNORMAL LOW (ref 8.9–10.3)
Chloride: 104 mmol/L (ref 98–111)
Creatinine, Ser: 0.61 mg/dL (ref 0.44–1.00)
GFR calc Af Amer: >60 mL/min (ref >60)
GFR calc non Af Amer: >60 mL/min (ref >60)
Glucose, Bld: 104 mg/dL — ABNORMAL HIGH (ref 70–99)
POTASSIUM: 4.1 mmol/L (ref 3.5–5.1)
Sodium: 135 mmol/L (ref 135–145)
TOTAL PROTEIN: 7.5 g/dL (ref 6.5–8.1)

## 2018-05-28 LAB — WET PREP, GENITAL
Sperm: NONE SEEN
Trich, Wet Prep: NONE SEEN

## 2018-05-28 LAB — URINALYSIS, ROUTINE W REFLEX MICROSCOPIC
Bacteria, UA: NONE SEEN
Bilirubin Urine: NEGATIVE
GLUCOSE, UA: NEGATIVE mg/dL
Ketones, ur: NEGATIVE mg/dL
Leukocytes, UA: NEGATIVE
Nitrite: NEGATIVE
PROTEIN: NEGATIVE mg/dL
Specific Gravity, Urine: 1.009 (ref 1.005–1.030)
pH: 7 (ref 5.0–8.0)

## 2018-05-28 LAB — POC OCCULT BLOOD, ED: Fecal Occult Bld: NEGATIVE

## 2018-05-28 LAB — LIPASE, BLOOD: Lipase: 29 U/L (ref 11–51)

## 2018-05-28 LAB — I-STAT BETA HCG BLOOD, ED (MC, WL, AP ONLY): I-stat hCG, quantitative: <5 m[IU]/mL (ref <5)

## 2018-05-28 MED ORDER — SODIUM CHLORIDE 0.9 % IV BOLUS
1000.0000 mL | Freq: Once | INTRAVENOUS | Status: AC
  Administered 2018-05-28: 1000 mL via INTRAVENOUS

## 2018-05-28 MED ORDER — SODIUM CHLORIDE 0.9% FLUSH: 3.0000 mL | Freq: Once | INTRAVENOUS | Status: DC

## 2018-05-28 MED ORDER — PROMETHAZINE HCL 25 MG PO TABS: 25.0000 mg | ORAL_TABLET | Freq: Four times a day (QID) | ORAL | 0 refills | Status: DC | PRN

## 2018-05-28 MED ORDER — MORPHINE SULFATE (PF) 4 MG/ML IV SOLN
4.0000 mg | Freq: Once | INTRAVENOUS | Status: AC
  Administered 2018-05-28: 4 mg via INTRAVENOUS
  Filled 2018-05-28: qty 1

## 2018-05-28 MED ORDER — ONDANSETRON HCL 4 MG/2ML IJ SOLN
4.0000 mg | Freq: Once | INTRAMUSCULAR | Status: AC
  Administered 2018-05-28: 4 mg via INTRAVENOUS
  Filled 2018-05-28: qty 2

## 2018-05-28 MED ORDER — METRONIDAZOLE 500 MG PO TABS: 500.0000 mg | ORAL_TABLET | Freq: Two times a day (BID) | ORAL | 0 refills | Status: DC

## 2018-05-28 MED ORDER — FLUCONAZOLE 150 MG PO TABS
150.0000 mg | ORAL_TABLET | Freq: Once | ORAL | Status: AC
  Administered 2018-05-28: 150 mg via ORAL
  Filled 2018-05-28: qty 1

## 2018-05-28 NOTE — ED Notes (Signed)
Pt asked for urine sample, but states that she had just gone. She reports that she will provide one next time.

## 2018-05-28 NOTE — ED Provider Notes (Signed)
[facility]-EMERGENCY DEPT Provider Note   CSN: [id number] Arrival date & time: 05/28/18  1220     History   Chief Complaint Chief Complaint  Patient presents with  . Abdominal Pain  . Blood In Stools    HPI Catherine Hayes is a 23 y.o. female.  The history is provided by the patient. No language interpreter was used.  Abdominal Pain     23 year old female with history of prior STI presenting for evaluation of abdominal pain and bloody stool.  Patient report for the past 2 days she has had pain to her epigastric region as well as her lower abdomen.  She described pain as a cramping sensation, waxing and waning, currently rates it 6 out of 10.  She also endorsed some nausea without vomiting.  She mentioned yesterday she was constipated and with her first bowel movement she did notice some bright red blood per rectum.  Quantify as several drops of blood into the toilet bowl.  Since then no rectal bleeding and denies any significant rectal pain or rectal itchiness.  She does admits to having hemorrhoids during pregnancy and felt that this could be related to that.  She does not complain of any fever or chills, lightheadedness dizziness chest pain shortness of breath dysuria or hematuria.  Her last menstrual period was a week ago.  She does not complain of any vaginal pain or vaginal discharge.  History reviewed. No pertinent past medical history.  Patient Active Problem List   Diagnosis Date Noted  . NSVD (normal spontaneous vaginal delivery) 08/17/2015  . Preterm labor with preterm delivery 08/17/2015  . Trichimoniasis 08/17/2015  . Gonorrhea affecting pregnancy 08/17/2015  . Chlamydia infection affecting pregnancy 08/17/2015  . Stillbirth of single fetus 08/17/2015  . Cervical insufficiency during pregnancy in second trimester, antepartum 08/16/2015  . Cervical insufficiency in pregnancy, antepartum 08/16/2015  . STD (sexually transmitted disease) complicating  pregnancy, antepartum 07/28/2015  . Supervision of normal first pregnancy 07/25/2015    Past Surgical History:  Procedure Laterality Date  . ANTERIOR LAT LUMBAR FUSION N/A 08/03/2012   Procedure: ANTERIOR LATERAL LUMBAR L1 CORPECTOMY 1 LEVEL;  Surgeon: Tia Alert, MD;  Location: [facility];  Service: Neurosurgery;  Laterality: N/A;  Anteriolateral lumbar one corpectomy, strut graft and plating  . WRIST SURGERY       OB History    Gravida  1   Para  1   Term      Preterm      AB      Living  0     SAB      TAB      Ectopic      Multiple  0   Live Births               Home Medications    Prior to Admission medications   Medication Sig Start Date End Date Taking? Authorizing Provider  acetaminophen (TYLENOL) 500 MG tablet Take 1,000 mg by mouth every 6 (six) hours as needed for headache.     [provider]  amoxicillin-clavulanate (AUGMENTIN) 875-125 MG tablet Take 1 tablet by mouth 2 (two) times daily. 05/11/16   Bethel Born, PA-C  ibuprofen (ADVIL,MOTRIN) 600 MG tablet Take 1 tablet (600 mg total) by mouth every 6 (six) hours. 03/14/16   de Villier, Daryl F II, PA    Family History Family History  Problem Relation Age of Onset  . Diabetes Mother   . Hypertension Mother   .  Diabetes Maternal Grandfather     Social History Social History   Tobacco Use  . Smoking status: Current Some Day Smoker    Types: Cigarettes  . Smokeless tobacco: Never Used  Substance Use Topics  . Alcohol use: No  . Drug use: No     Allergies   Patient has no known allergies.   Review of Systems Review of Systems  Gastrointestinal: Positive for abdominal pain.  All other systems reviewed and are negative.    Physical Exam Updated Vital Signs BP 114/64 (BP Location: Left Arm)   Pulse 80   Temp 98.5 F (36.9 C) (Oral)   Resp 16   LMP 05/13/2018 (Approximate)   SpO2 100%   Physical Exam Vitals signs and nursing note reviewed.    Constitutional:      General: She is not in acute distress.    Appearance: She is well-developed.  HENT:     Head: Atraumatic.  Eyes:     Conjunctiva/sclera: Conjunctivae normal.  Neck:     Musculoskeletal: Neck supple.  Cardiovascular:     Rate and Rhythm: Normal rate and regular rhythm.  Pulmonary:     Effort: Pulmonary effort is normal.     Breath sounds: Normal breath sounds.  Abdominal:     General: Abdomen is flat. Bowel sounds are normal.     Palpations: Abdomen is soft.     Tenderness: There is abdominal tenderness in the epigastric area and suprapubic area.     Hernia: No hernia is present.  Genitourinary:    Comments: Pelvic exam performed with permission of pt and female ED tach assist during exam.  External genitalia w/out lesions.  Vaginal vault with normal functional discharge.  Cervix w/out lesions, not friable, GC/Chlamydia and wet prep obtained and sent to lab.  Bimanual exam w/out CMT, uterine or adnexal tenderness  Skin:    Findings: No rash.  Neurological:     Mental Status: She is alert.      ED Treatments / Results  Labs (all labs ordered are listed, but only abnormal results are displayed) Labs Reviewed  COMPREHENSIVE METABOLIC PANEL - Abnormal; Notable for the following components:      Result Value   Glucose, Bld 104 (*)    Calcium 8.8 (*)    AST 14 (*)    All other components within normal limits  LIPASE, BLOOD  CBC  URINALYSIS, ROUTINE W REFLEX MICROSCOPIC  I-STAT BETA HCG BLOOD, ED ([facility], [facility], [facility] ONLY)  POC OCCULT BLOOD, ED    EKG None  Radiology No results found.  Procedures Procedures (including critical care time)  Medications Ordered in ED Medications  sodium chloride flush (NS) 0.9 % injection 3 mL (has no administration in time range)  fluconazole (DIFLUCAN) tablet 150 mg (has no administration in time range)  morphine 4 MG/ML injection 4 mg (4 mg Intravenous Given 05/28/18 1741)  ondansetron (ZOFRAN) injection 4 mg (4 mg  Intravenous Given 05/28/18 1741)  sodium chloride 0.9 % bolus 1,000 mL (1,000 mLs Intravenous New Bag/Given 05/28/18 1742)     Initial Impression / Assessment and Plan / ED Course  I have reviewed the triage vital signs and the nursing notes.  Pertinent labs & imaging results that were available during my care of the patient were reviewed by me and considered in my medical decision making (see chart for details).     BP 114/64 (BP Location: Left Arm)   Pulse 80   Temp 98.5 F (36.9 C) (Oral)  Resp 16   LMP 05/13/2018 (Approximate)   SpO2 100%    Final Clinical Impressions(s) / ED Diagnoses   Final diagnoses:  Rectal bleeding  Yeast infection  BV (bacterial vaginosis)    ED Discharge Orders         Ordered    metroNIDAZOLE (FLAGYL) 500 MG tablet  2 times daily     05/28/18 1923    promethazine (PHENERGAN) 25 MG tablet  Every 6 hours PRN     05/28/18 1923         5:30 PM Patient complaining of abdominal discomfort as well as noticing bright red blood per rectum.  No active rectal bleeding on exam.  Abdomen is nonsurgical.  Since patient does have lower abdominal pain, will perform pelvic examination.  Pain medication, antinausea medication, and IV fluid given.  Patient otherwise well-appearing.  7:20 PM UA shows no evidence of urinary tract infection, wet prep with evidence of yeast, and clue cells and moderate amount of WBC.  Will provide appropriate treatment for that.  Her fecal occult blood test is negative, pregnancy test is negative, normal WBC, normal H&H, normal lipase.  In the setting of normal labs, I have low suspicion for acute abdominal pathology causing her symptoms.  I suspect rectal bleeding is likely secondary to internal hemorrhoid.  She is hemodynamically stable.  Patient will receive Diflucan for yeast infection and Flagyl for bacterial vaginosis.  Return precautions discussed.   Fayrene Helperran, Jermani Eberlein, PA-C 05/28/18 1924    Lorre NickAllen, Anthony, MD 05/28/18  [phone]

## 2018-05-28 NOTE — ED Triage Notes (Signed)
Pt presents with c/o abdominal pain and blood in her stool that started 2 days ago. Pt reports she has only seen the blood one time.

## 2018-05-28 NOTE — Discharge Instructions (Addendum)
You have been evaluated for your abdominal pain and rectal bleeding.  Your rectal bleeding is likely secondary to an internal hemorrhoid.  Please eat foods high in fibers to help regulate your stool and decrease risk of constipation which can irritate your hemorrhoid.  He also been diagnosed with yeast infection, you did receive antifungal medication while in the ED.  You also been diagnosed with bacterial vaginosis, take Flagyl as prescribed but do not drink alcohol while taking this medication as it could make you sick.  Take over-the-counter pain medication as needed for your abdominal pain.

## 2018-05-29 LAB — GC/CHLAMYDIA PROBE AMP (~~LOC~~) NOT AT ARMC
Chlamydia: POSITIVE — AB
Neisseria Gonorrhea: NEGATIVE

## 2018-11-05 DIAGNOSIS — Z0389 Encounter for observation for other suspected diseases and conditions ruled out: Secondary | ICD-10-CM | POA: Diagnosis not present

## 2018-11-05 DIAGNOSIS — Z3009 Encounter for other general counseling and advice on contraception: Secondary | ICD-10-CM | POA: Diagnosis not present

## 2018-11-05 DIAGNOSIS — Z1388 Encounter for screening for disorder due to exposure to contaminants: Secondary | ICD-10-CM | POA: Diagnosis not present

## 2019-07-02 ENCOUNTER — Emergency Department (HOSPITAL_COMMUNITY)
Admission: EM | Admit: 2019-07-02 | Discharge: 2019-07-02 | Disposition: A | Discharge status: Home / Self Care | Payer: Self-pay | Attending: Emergency Medicine | Admitting: Emergency Medicine

## 2019-07-02 ENCOUNTER — Other Ambulatory Visit: Payer: Self-pay

## 2019-07-02 ENCOUNTER — Encounter (HOSPITAL_COMMUNITY): Payer: Self-pay

## 2019-07-02 ENCOUNTER — Emergency Department (HOSPITAL_COMMUNITY): Payer: Self-pay

## 2019-07-02 DIAGNOSIS — O99891 Other specified diseases and conditions complicating pregnancy: Secondary | ICD-10-CM | POA: Insufficient documentation

## 2019-07-02 DIAGNOSIS — F17210 Nicotine dependence, cigarettes, uncomplicated: Secondary | ICD-10-CM | POA: Insufficient documentation

## 2019-07-02 DIAGNOSIS — R102 Pelvic and perineal pain: Secondary | ICD-10-CM

## 2019-07-02 DIAGNOSIS — R1030 Lower abdominal pain, unspecified: Secondary | ICD-10-CM | POA: Insufficient documentation

## 2019-07-02 DIAGNOSIS — O99331 Smoking (tobacco) complicating pregnancy, first trimester: Secondary | ICD-10-CM | POA: Insufficient documentation

## 2019-07-02 DIAGNOSIS — Z3A01 Less than 8 weeks gestation of pregnancy: Secondary | ICD-10-CM | POA: Insufficient documentation

## 2019-07-02 LAB — URINALYSIS, ROUTINE W REFLEX MICROSCOPIC
Bilirubin Urine: NEGATIVE
Glucose, UA: NEGATIVE mg/dL
Hgb urine dipstick: NEGATIVE
Ketones, ur: NEGATIVE mg/dL
Leukocytes,Ua: NEGATIVE
Nitrite: NEGATIVE
Protein, ur: NEGATIVE mg/dL
Specific Gravity, Urine: 1.004 — ABNORMAL LOW (ref 1.005–1.030)
pH: 8 (ref 5.0–8.0)

## 2019-07-02 LAB — COMPREHENSIVE METABOLIC PANEL
ALT: 12 U/L (ref 0–44)
AST: 15 U/L (ref 15–41)
Albumin: 4.5 g/dL (ref 3.5–5.0)
Alkaline Phosphatase: 51 U/L (ref 38–126)
Anion gap: 8 (ref 5–15)
BUN: 8 mg/dL (ref 6–20)
CO2: 25 mmol/L (ref 22–32)
Calcium: 9.3 mg/dL (ref 8.9–10.3)
Chloride: 102 mmol/L (ref 98–111)
Creatinine, Ser: 0.48 mg/dL (ref 0.44–1.00)
GFR calc Af Amer: >60 mL/min (ref >60)
GFR calc non Af Amer: >60 mL/min (ref >60)
Glucose, Bld: 91 mg/dL (ref 70–99)
Potassium: 3.6 mmol/L (ref 3.5–5.1)
Sodium: 135 mmol/L (ref 135–145)
Total Bilirubin: 0.8 mg/dL (ref 0.3–1.2)
Total Protein: 7.8 g/dL (ref 6.5–8.1)

## 2019-07-02 LAB — CBC
HCT: 41.4 % (ref 36.0–46.0)
Hemoglobin: 14 g/dL (ref 12.0–15.0)
MCH: 31.9 pg (ref 26.0–34.0)
MCHC: 33.8 g/dL (ref 30.0–36.0)
MCV: 94.3 fL (ref 80.0–100.0)
Platelets: 268 10*3/uL (ref 150–400)
RBC: 4.39 MIL/uL (ref 3.87–5.11)
RDW: 12.1 % (ref 11.5–15.5)
WBC: 7.2 10*3/uL (ref 4.0–10.5)
nRBC: 0 % (ref 0.0–0.2)

## 2019-07-02 LAB — I-STAT BETA HCG BLOOD, ED (MC, WL, AP ONLY): I-stat hCG, quantitative: >2000 m[IU]/mL — ABNORMAL HIGH (ref <5)

## 2019-07-02 LAB — HCG, QUANTITATIVE, PREGNANCY: hCG, Beta Chain, Quant, S: 29284 m[IU]/mL — ABNORMAL HIGH (ref <5)

## 2019-07-02 LAB — LIPASE, BLOOD: Lipase: 21 U/L (ref 11–51)

## 2019-07-02 MED ORDER — SODIUM CHLORIDE 0.9% FLUSH: 3.0000 mL | Freq: Once | INTRAVENOUS | Status: DC

## 2019-07-02 MED ORDER — SODIUM CHLORIDE 0.9 % IV BOLUS
1000.0000 mL | Freq: Once | INTRAVENOUS | Status: AC
  Administered 2019-07-02: 16:00:00 1000 mL via INTRAVENOUS

## 2019-07-02 MED ORDER — ONDANSETRON HCL 4 MG/2ML IJ SOLN
4.0000 mg | Freq: Once | INTRAMUSCULAR | Status: AC
  Administered 2019-07-02: 4 mg via INTRAVENOUS
  Filled 2019-07-02: qty 2

## 2019-07-02 MED ORDER — METOCLOPRAMIDE HCL 10 MG PO TABS: 10.0000 mg | ORAL_TABLET | Freq: Three times a day (TID) | ORAL | 0 refills | Status: DC | PRN

## 2019-07-02 MED ORDER — PRENATAL COMPLETE 14-0.4 MG PO TABS: 1.0000 | ORAL_TABLET | Freq: Every day | ORAL | 0 refills | Status: AC

## 2019-07-02 NOTE — Discharge Instructions (Signed)
You are 5 weeks and 6 days pregnant.  Your due date will be October 23.

## 2019-07-02 NOTE — ED Notes (Signed)
I attempted to collect blood and did not feel anywhere to collect from

## 2019-07-02 NOTE — ED Provider Notes (Signed)
[facility]-EMERGENCY DEPT Provider Note   CSN: [id number] Arrival date & time: 07/02/19  1246     History Chief Complaint  Patient presents with  . Abdominal Pain  . Pelvic Pain    Catherine Hayes is a 24 y.o. female.  HPI Patient presents abdominal pain.  Has had for last couple days.  It is in her lower abdomen.  Has had nausea vomiting diarrhea.  States she vomited and had diarrhea at the same time.  No fevers.  Pain is dull and somewhat crampy.  Mild change in vaginal discharge.  Does not think she is pregnant.  No fevers or chills.  No sick contacts.  Somewhat decreased appetite.    History reviewed. No pertinent past medical history.  Patient Active Problem List   Diagnosis Date Noted  . NSVD (normal spontaneous vaginal delivery) 08/17/2015  . Preterm labor with preterm delivery 08/17/2015  . Trichimoniasis 08/17/2015  . Gonorrhea affecting pregnancy 08/17/2015  . Chlamydia infection affecting pregnancy 08/17/2015  . Stillbirth of single fetus 08/17/2015  . Cervical insufficiency during pregnancy in second trimester, antepartum 08/16/2015  . Cervical insufficiency in pregnancy, antepartum 08/16/2015  . STD (sexually transmitted disease) complicating pregnancy, antepartum 07/28/2015  . Supervision of normal first pregnancy 07/25/2015    Past Surgical History:  Procedure Laterality Date  . ANTERIOR LAT LUMBAR FUSION N/A 08/03/2012   Procedure: ANTERIOR LATERAL LUMBAR L1 CORPECTOMY 1 LEVEL;  Surgeon: Tia Alert, MD;  Location: [facility];  Service: Neurosurgery;  Laterality: N/A;  Anteriolateral lumbar one corpectomy, strut graft and plating  . WRIST SURGERY       OB History    Gravida  1   Para  1   Term      Preterm      AB      Living  0     SAB      TAB      Ectopic      Multiple  0   Live Births              Family History  Problem Relation Age of Onset  . Diabetes Mother   . Hypertension Mother   . Diabetes  Maternal Grandfather     Social History   Tobacco Use  . Smoking status: Current Some Day Smoker    Types: Cigarettes  . Smokeless tobacco: Never Used  Substance Use Topics  . Alcohol use: No  . Drug use: No    Home Medications Prior to Admission medications   Medication Sig Start Date End Date Taking? Authorizing Provider  acetaminophen (TYLENOL) 500 MG tablet Take 1,000 mg by mouth every 6 (six) hours as needed for mild pain, moderate pain or headache.    Yes [provider]  amoxicillin-clavulanate (AUGMENTIN) 875-125 MG tablet Take 1 tablet by mouth 2 (two) times daily. Patient not taking: Reported on 05/28/2018 05/11/16   Bethel Born, PA-C  ibuprofen (ADVIL,MOTRIN) 600 MG tablet Take 1 tablet (600 mg total) by mouth every 6 (six) hours. Patient not taking: Reported on 05/28/2018 03/14/16   de Villier, Daryl F II, PA  metoCLOPramide (REGLAN) 10 MG tablet Take 1 tablet (10 mg total) by mouth every 8 (eight) hours as needed for nausea or vomiting. 07/02/19   Benjiman Core, MD  metroNIDAZOLE (FLAGYL) 500 MG tablet Take 1 tablet (500 mg total) by mouth 2 (two) times daily. One po bid x 7 days Patient not taking: Reported on 07/02/2019 05/28/18  Domenic Moras, PA-C  Prenatal Vit-Fe Fumarate-FA (PRENATAL COMPLETE) 14-0.4 MG TABS Take 1 tablet by mouth daily. 07/02/19   Davonna Belling, MD  promethazine (PHENERGAN) 25 MG tablet Take 1 tablet (25 mg total) by mouth every 6 (six) hours as needed for nausea. Patient not taking: Reported on 07/02/2019 05/28/18   Domenic Moras, PA-C    Allergies    Patient has no known allergies.  Review of Systems   Review of Systems  Constitutional: Positive for appetite change.  HENT: Negative for congestion.   Respiratory: Negative for shortness of breath.   Gastrointestinal: Positive for abdominal pain, diarrhea, nausea and vomiting.  Genitourinary: Negative for vaginal bleeding and vaginal pain.       Patient states mild change in her  vaginal discharge.  Menses due in a few days.  Musculoskeletal: Negative for back pain.  Neurological: Negative for weakness.    Physical Exam Updated Vital Signs BP (!) 112/57   Pulse 69   Temp 98.2 F (36.8 C) (Oral)   Resp 18   Ht 5\' 6"  (1.676 m)   Wt 56.7 kg   LMP 06/08/2019 (Approximate)   SpO2 100%   BMI 20.18 kg/m   Physical Exam Vitals reviewed.  HENT:     Head: Normocephalic.  Cardiovascular:     Rate and Rhythm: Normal rate and regular rhythm.  Pulmonary:     Breath sounds: Normal breath sounds.  Abdominal:     Tenderness: There is no abdominal tenderness.     Hernia: No hernia is present.     Comments: No mass.  No tenderness.  No rebound or guarding.  Skin:    General: Skin is warm.  Neurological:     Mental Status: She is alert.     Comments: Patient is awake and appropriate.     ED Results / Procedures / Treatments   Labs (all labs ordered are listed, but only abnormal results are displayed) Labs Reviewed  URINALYSIS, ROUTINE W REFLEX MICROSCOPIC - Abnormal; Notable for the following components:      Result Value   Color, Urine STRAW (*)    Specific Gravity, Urine 1.004 (*)    All other components within normal limits  HCG, QUANTITATIVE, PREGNANCY - Abnormal; Notable for the following components:   hCG, Beta Chain, Quant, S 29,284 (*)    All other components within normal limits  I-STAT BETA HCG BLOOD, ED ([facility], [facility], [facility] ONLY) - Abnormal; Notable for the following components:   I-stat hCG, quantitative >2,000.0 (*)    All other components within normal limits  LIPASE, BLOOD  COMPREHENSIVE METABOLIC PANEL  CBC    EKG None  Radiology US OB LESS THAN 14 WEEKS WITH OB TRANSVAGINAL  Result Date: 07/02/2019 CLINICAL DATA:  Initial evaluation for acute abdominal pain, early pregnancy. EXAM: OBSTETRIC <14 WK [place] AND TRANSVAGINAL OB US TECHNIQUE: Both transabdominal and transvaginal ultrasound examinations were performed for complete evaluation of the  gestation as well as the maternal uterus, adnexal regions, and pelvic cul-de-sac. Transvaginal technique was performed to assess early pregnancy. COMPARISON:  None. FINDINGS: Intrauterine gestational sac: Single Yolk sac:  Present Embryo:  Present Cardiac Activity: Present Heart Rate: 86 bpm CRL: 2.4 mm   5 w   6 d                  [place] EDC: 02/26/2020 Subchorionic hemorrhage:  None visualized. Maternal uterus/adnexae: Ovaries are normal in appearance bilaterally. Small corpus luteal cyst noted on the right. No adnexal  mass or free fluid. IMPRESSION: 1. Single viable intrauterine pregnancy as above, estimated gestational age [redacted] weeks and 6 days by crown-rump length, with ultrasound EDC of 02/26/2020. No adverse features. 2. No other acute maternal uterine or adnexal abnormality identified. Electronically Signed   By: Rise Mu M.D.   On: 07/02/2019 18:43    Procedures Procedures (including critical care time)  Medications Ordered in ED Medications  sodium chloride flush (NS) 0.9 % injection 3 mL (3 mLs Intravenous Not Given 07/02/19 2001)  sodium chloride 0.9 % bolus 1,000 mL (0 mLs Intravenous Stopped 07/02/19 1757)  ondansetron (ZOFRAN) injection 4 mg (4 mg Intravenous Given 07/02/19 1621)    ED Course  I have reviewed the triage vital signs and the nursing notes.  Pertinent labs & imaging results that were available during my care of the patient were reviewed by me and considered in my medical decision making (see chart for details).    MDM Rules/Calculators/A&P                      Patient's lower abdominal pain.  Nausea and diarrhea.  Found a positive pregnancy test.  Ultrasound done and showed intrauterine pregnancy.  Has due date of October 23.  Will discharge home with symptomatic treatment and prenatal vitamins Final Clinical Impression(s) / ED Diagnoses Final diagnoses:  Less than [redacted] weeks gestation of pregnancy    Rx / DC Orders ED Discharge Orders         Ordered     metoCLOPramide (REGLAN) 10 MG tablet  Every 8 hours PRN     07/02/19 1954    Prenatal Vit-Fe Fumarate-FA (PRENATAL COMPLETE) 14-0.4 MG TABS  Daily     07/02/19 1954           Benjiman Core, MD 07/02/19 2316

## 2019-07-02 NOTE — ED Triage Notes (Signed)
Patient c/o lower abdominal pain x 3-4 days. Patient denies any vaginal drainage or dysuria.

## 2019-07-02 NOTE — ED Notes (Signed)
Pt up walking around. NAD noted. Pt ready for d/c 

## 2019-08-25 ENCOUNTER — Ambulatory Visit: Payer: Medicaid Other

## 2019-08-25 DIAGNOSIS — Z3490 Encounter for supervision of normal pregnancy, unspecified, unspecified trimester: Secondary | ICD-10-CM

## 2019-08-25 MED ORDER — BLOOD PRESSURE CUFF MISC: 1.0000 | 0 refills | Status: DC

## 2019-08-25 NOTE — Progress Notes (Signed)
Pt did not answer nurse call for New OB intake, no option for VM. BP cuff sent to [facility].

## 2019-09-06 ENCOUNTER — Ambulatory Visit (INDEPENDENT_AMBULATORY_CARE_PROVIDER_SITE_OTHER): Payer: Medicaid Other | Admitting: Obstetrics

## 2019-09-06 ENCOUNTER — Other Ambulatory Visit: Payer: Self-pay

## 2019-09-06 ENCOUNTER — Other Ambulatory Visit (HOSPITAL_COMMUNITY)
Admission: RE | Admit: 2019-09-06 | Discharge: 2019-09-06 | Disposition: A | Discharge status: Home / Self Care | Payer: Medicaid Other | Source: Ambulatory Visit | Attending: Obstetrics | Admitting: Obstetrics

## 2019-09-06 ENCOUNTER — Encounter: Payer: Self-pay | Admitting: Obstetrics

## 2019-09-06 VITALS — BP 119/74 | HR 80 | Wt 139.0 lb

## 2019-09-06 DIAGNOSIS — O0990 Supervision of high risk pregnancy, unspecified, unspecified trimester: Secondary | ICD-10-CM

## 2019-09-06 DIAGNOSIS — Z3A15 15 weeks gestation of pregnancy: Secondary | ICD-10-CM

## 2019-09-06 DIAGNOSIS — O219 Vomiting of pregnancy, unspecified: Secondary | ICD-10-CM

## 2019-09-06 DIAGNOSIS — O09212 Supervision of pregnancy with history of pre-term labor, second trimester: Secondary | ICD-10-CM | POA: Diagnosis not present

## 2019-09-06 DIAGNOSIS — Z113 Encounter for screening for infections with a predominantly sexual mode of transmission: Secondary | ICD-10-CM

## 2019-09-06 DIAGNOSIS — J301 Allergic rhinitis due to pollen: Secondary | ICD-10-CM

## 2019-09-06 DIAGNOSIS — Z8751 Personal history of pre-term labor: Secondary | ICD-10-CM

## 2019-09-06 DIAGNOSIS — O09292 Supervision of pregnancy with other poor reproductive or obstetric history, second trimester: Secondary | ICD-10-CM

## 2019-09-06 MED ORDER — VITAFOL ULTRA 29-0.6-0.4-200 MG PO CAPS: 1.0000 | ORAL_CAPSULE | Freq: Every day | ORAL | 4 refills | Status: DC

## 2019-09-06 MED ORDER — LORATADINE 10 MG PO TABS: 10.0000 mg | ORAL_TABLET | Freq: Every day | ORAL | 11 refills | Status: DC

## 2019-09-06 MED ORDER — ASPIRIN 81 MG PO CHEW: 81.0000 mg | CHEWABLE_TABLET | Freq: Every day | ORAL | 6 refills | Status: DC

## 2019-09-06 MED ORDER — DOXYLAMINE-PYRIDOXINE 10-10 MG PO TBEC: DELAYED_RELEASE_TABLET | ORAL | 5 refills | Status: DC

## 2019-09-06 NOTE — Addendum Note (Signed)
Addended by: Coral Ceo A on: 09/06/2019 01:36 PM   Modules accepted: Orders

## 2019-09-06 NOTE — Progress Notes (Signed)
Pt would like to discuss prenatal vitamins.

## 2019-09-06 NOTE — Progress Notes (Signed)
Subjective:    Catherine Hayes is being seen today for her first obstetrical visit.  This is not a planned pregnancy. She is at [redacted]w[redacted]d gestation. Her obstetrical history is significant for previous preterm labor and delivery at 20 weeks after an STD infection with probable PID that was treated. Relationship with FOB: significant other, not living together. Patient does intend to breast feed. Pregnancy history fully reviewed.  The information documented in the HPI was reviewed and verified.  Menstrual History: OB History    Gravida  2   Para  1   Term      Preterm      AB      Living  0     SAB      TAB      Ectopic      Multiple  0   Live Births               Patient's last menstrual period was 06/08/2019 (approximate).    History reviewed. No pertinent past medical history.  Past Surgical History:  Procedure Laterality Date  . ANTERIOR LAT LUMBAR FUSION N/A 08/03/2012   Procedure: ANTERIOR LATERAL LUMBAR L1 CORPECTOMY 1 LEVEL;  Surgeon: Tia Alert, MD;  Location: [facility];  Service: Neurosurgery;  Laterality: N/A;  Anteriolateral lumbar one corpectomy, strut graft and plating  . WRIST SURGERY      (Not in a hospital admission)  No Known Allergies  Social History   Tobacco Use  . Smoking status: Current Some Day Smoker    Types: Cigarettes  . Smokeless tobacco: Never Used  . Tobacco comment: rarely  Substance Use Topics  . Alcohol use: No    Family History  Problem Relation Age of Onset  . Diabetes Mother   . Hypertension Mother   . Diabetes Maternal Grandfather      Review of Systems Constitutional: negative for weight loss Gastrointestinal: negative for vomiting Genitourinary:negative for genital lesions and vaginal discharge and dysuria Musculoskeletal:negative for back pain Behavioral/Psych: negative for abusive relationship, depression, illegal drug usage and tobacco use    Objective:    BP 119/74   Pulse 80   Wt 139 lb (63 kg)    LMP 06/08/2019 (Approximate)   BMI 22.44 kg/m  General Appearance:    Alert, cooperative, no distress, appears stated age  Head:    Normocephalic, without obvious abnormality, atraumatic  Eyes:    PERRL, conjunctiva/corneas clear, EOM's intact, fundi    benign, both eyes  Ears:    Normal TM's and external ear canals, both ears  Nose:   Nares normal, septum midline, mucosa normal, no drainage    or sinus tenderness  Throat:   Lips, mucosa, and tongue normal; teeth and gums normal  Neck:   Supple, symmetrical, trachea midline, no adenopathy;    thyroid:  no enlargement/tenderness/nodules; no carotid   bruit or JVD  Back:     Symmetric, no curvature, ROM normal, no CVA tenderness  Lungs:     Clear to auscultation bilaterally, respirations unlabored  Chest Wall:    No tenderness or deformity   Heart:    Regular rate and rhythm, S1 and S2 normal, no murmur, rub   or gallop  Breast Exam:    No tenderness, masses, or nipple abnormality  Abdomen:     Soft, non-tender, bowel sounds active all four quadrants,    no masses, no organomegaly  Genitalia:    Normal female without lesion, discharge or tenderness  Extremities:  Extremities normal, atraumatic, no cyanosis or edema  Pulses:   2+ and symmetric all extremities  Skin:   Skin color, texture, turgor normal, no rashes or lesions  Lymph nodes:   Cervical, supraclavicular, and axillary nodes normal  Neurologic:   CNII-XII intact, normal strength, sensation and reflexes    throughout      Lab Review Urine pregnancy test Labs reviewed yes Radiologic studies reviewed no Assessment:    Pregnancy at [redacted]w[redacted]d weeks    Plan:     1. Supervision of high risk pregnancy, antepartum Rx: - Obstetric Panel, Including HIV - Genetic Screening - Cytology - PAP( [facility]) - Cervicovaginal ancillary only( [facility]) - Culture, OB Urine - Prenat-Fe Poly-Methfol-FA-DHA (VITAFOL ULTRA) 29-0.6-0.4-200 MG CAPS; Take 1 capsule by mouth daily before  breakfast.  Dispense: 90 capsule; Refill: 4  2. History of preterm delivery Rx: - aspirin 81 MG chewable tablet; Chew 1 tablet (81 mg total) by mouth daily.  Dispense: 30 tablet; Refill: 6 - [place] MFM OB DETAIL +14 WK; Future  3. History of stillbirth in pregnant patient in second trimester, antepartum - referred to MFM for early ultrasound - pelvic rest to avoid STD's  4. Screen for STD (sexually transmitted disease)    Prenatal vitamins.  Counseling provided regarding continued use of seat belts, cessation of alcohol consumption, smoking or use of illicit drugs; infection precautions i.e., influenza/TDAP immunizations, toxoplasmosis,CMV, parvovirus, listeria and varicella; workplace safety, exercise during pregnancy; routine dental care, safe medications, sexual activity, hot tubs, saunas, pools, travel, caffeine use, fish and methlymercury, potential toxins, hair treatments, varicose veins Weight gain recommendations per IOM guidelines reviewed: underweight/BMI< 18.5--> gain 28 - 40 lbs; normal weight/BMI 18.5 - 24.9--> gain 25 - 35 lbs; overweight/BMI 25 - 29.9--> gain 15 - 25 lbs; obese/BMI >30->gain  11 - 20 lbs Problem list reviewed and updated. FIRST/CF mutation testing/NIPT/QUAD SCREEN/fragile X/Ashkenazi Jewish population testing/Spinal muscular atrophy discussed: requested. Role of ultrasound in pregnancy discussed; fetal survey: requested. Amniocentesis discussed: not indicated   Meds ordered this encounter  Medications  . aspirin 81 MG chewable tablet    Sig: Chew 1 tablet (81 mg total) by mouth daily.    Dispense:  30 tablet    Refill:  6  . Prenat-Fe Poly-Methfol-FA-DHA (VITAFOL ULTRA) 29-0.6-0.4-200 MG CAPS    Sig: Take 1 capsule by mouth daily before breakfast.    Dispense:  90 capsule    Refill:  4   Orders Placed This Encounter  Procedures  . Culture, OB Urine  . [place] MFM OB DETAIL +14 WK    Standing Status:   Future    Standing Expiration Date:   11/05/2020     Order Specific Question:   Reason for Exam (SYMPTOM  OR DIAGNOSIS REQUIRED)    Answer:   History of cervical insufficiency and preterm labor and delivery at 68 weeks    Order Specific Question:   Preferred Location    Answer:   [facility] Ultrasound  . Obstetric Panel, Including HIV  . Genetic Screening    Follow up in 2 weeks. 50% of 25 min visit spent on counseling and coordination of care.    Shelly Bombard, MD 09/06/2019 12:45 PM

## 2019-09-07 LAB — OBSTETRIC PANEL, INCLUDING HIV
Antibody Screen: NEGATIVE
Basophils Absolute: 0 10*3/uL (ref 0.0–0.2)
Basos: 0 %
EOS (ABSOLUTE): 0.1 10*3/uL (ref 0.0–0.4)
Eos: 1 %
HIV Screen 4th Generation wRfx: NONREACTIVE
Hematocrit: 37.6 % (ref 34.0–46.6)
Hemoglobin: 12.5 g/dL (ref 11.1–15.9)
Hepatitis B Surface Ag: NEGATIVE
Immature Grans (Abs): 0 10*3/uL (ref 0.0–0.1)
Immature Granulocytes: 0 %
Lymphocytes Absolute: 1.2 10*3/uL (ref 0.7–3.1)
Lymphs: 15 %
MCH: 31.5 pg (ref 26.6–33.0)
MCHC: 33.2 g/dL (ref 31.5–35.7)
MCV: 95 fL (ref 79–97)
Monocytes Absolute: 0.8 10*3/uL (ref 0.1–0.9)
Monocytes: 9 %
Neutrophils Absolute: 6.1 10*3/uL (ref 1.4–7.0)
Neutrophils: 75 %
Platelets: 244 10*3/uL (ref 150–450)
RBC: 3.97 x10E6/uL (ref 3.77–5.28)
RDW: 12.8 % (ref 11.7–15.4)
RPR Ser Ql: NONREACTIVE
Rh Factor: POSITIVE
Rubella Antibodies, IGG: 5.14 index (ref >0.99)
WBC: 8.1 10*3/uL (ref 3.4–10.8)

## 2019-09-07 LAB — CERVICOVAGINAL ANCILLARY ONLY
Bacterial Vaginitis (gardnerella): POSITIVE — AB
Candida Glabrata: NEGATIVE
Candida Vaginitis: NEGATIVE
Chlamydia: NEGATIVE
Comment: NEGATIVE
Comment: NEGATIVE
Comment: NEGATIVE
Comment: NEGATIVE
Comment: NEGATIVE
Comment: NORMAL
Neisseria Gonorrhea: NEGATIVE
Trichomonas: NEGATIVE

## 2019-09-08 ENCOUNTER — Other Ambulatory Visit: Payer: Self-pay | Admitting: Obstetrics

## 2019-09-08 DIAGNOSIS — N760 Acute vaginitis: Secondary | ICD-10-CM

## 2019-09-08 DIAGNOSIS — B9689 Other specified bacterial agents as the cause of diseases classified elsewhere: Secondary | ICD-10-CM

## 2019-09-08 LAB — CULTURE, OB URINE

## 2019-09-08 LAB — URINE CULTURE, OB REFLEX

## 2019-09-08 MED ORDER — TINIDAZOLE 500 MG PO TABS: 1000.0000 mg | ORAL_TABLET | Freq: Every day | ORAL | 2 refills | Status: DC

## 2019-09-10 LAB — CYTOLOGY - PAP
Comment: NEGATIVE
Diagnosis: UNDETERMINED — AB
High risk HPV: POSITIVE — AB

## 2019-09-13 ENCOUNTER — Encounter: Payer: Self-pay | Admitting: Obstetrics

## 2019-09-14 ENCOUNTER — Other Ambulatory Visit: Payer: Self-pay | Admitting: Obstetrics

## 2019-09-20 ENCOUNTER — Encounter: Payer: Medicaid Other | Admitting: Obstetrics & Gynecology

## 2019-09-20 ENCOUNTER — Telehealth: Payer: Medicaid Other | Admitting: Obstetrics

## 2019-09-22 ENCOUNTER — Other Ambulatory Visit: Payer: Self-pay | Admitting: *Deleted

## 2019-09-22 ENCOUNTER — Other Ambulatory Visit: Payer: Self-pay

## 2019-09-22 ENCOUNTER — Ambulatory Visit: Payer: Medicaid Other | Attending: Obstetrics

## 2019-09-22 ENCOUNTER — Ambulatory Visit: Payer: Medicaid Other | Admitting: *Deleted

## 2019-09-22 VITALS — BP 114/67 | HR 76

## 2019-09-22 DIAGNOSIS — Z8759 Personal history of other complications of pregnancy, childbirth and the puerperium: Secondary | ICD-10-CM

## 2019-09-22 DIAGNOSIS — O0990 Supervision of high risk pregnancy, unspecified, unspecified trimester: Secondary | ICD-10-CM | POA: Insufficient documentation

## 2019-09-22 DIAGNOSIS — O09292 Supervision of pregnancy with other poor reproductive or obstetric history, second trimester: Secondary | ICD-10-CM

## 2019-09-22 DIAGNOSIS — Z3A17 17 weeks gestation of pregnancy: Secondary | ICD-10-CM | POA: Diagnosis not present

## 2019-09-22 DIAGNOSIS — Z8751 Personal history of pre-term labor: Secondary | ICD-10-CM | POA: Insufficient documentation

## 2019-09-22 DIAGNOSIS — Z363 Encounter for antenatal screening for malformations: Secondary | ICD-10-CM

## 2019-09-23 ENCOUNTER — Encounter: Payer: Self-pay | Admitting: Obstetrics & Gynecology

## 2019-09-23 ENCOUNTER — Other Ambulatory Visit: Payer: Self-pay | Admitting: *Deleted

## 2019-09-23 ENCOUNTER — Ambulatory Visit (INDEPENDENT_AMBULATORY_CARE_PROVIDER_SITE_OTHER): Payer: Medicaid Other | Admitting: Obstetrics & Gynecology

## 2019-09-23 VITALS — BP 113/68 | HR 74 | Wt 142.2 lb

## 2019-09-23 DIAGNOSIS — Z3402 Encounter for supervision of normal first pregnancy, second trimester: Secondary | ICD-10-CM

## 2019-09-23 DIAGNOSIS — R87610 Atypical squamous cells of undetermined significance on cytologic smear of cervix (ASC-US): Secondary | ICD-10-CM

## 2019-09-23 DIAGNOSIS — O09892 Supervision of other high risk pregnancies, second trimester: Secondary | ICD-10-CM

## 2019-09-23 DIAGNOSIS — O0990 Supervision of high risk pregnancy, unspecified, unspecified trimester: Secondary | ICD-10-CM

## 2019-09-23 DIAGNOSIS — R87810 Cervical high risk human papillomavirus (HPV) DNA test positive: Secondary | ICD-10-CM

## 2019-09-23 DIAGNOSIS — O3430 Maternal care for cervical incompetence, unspecified trimester: Secondary | ICD-10-CM

## 2019-09-23 NOTE — Progress Notes (Signed)
Patient present for ROB. Patient has no concerns today. She states that she has not picked up her antibiotic for bv or her baby asa. She will pick these up today.

## 2019-09-23 NOTE — Progress Notes (Signed)
    PRENATAL VISIT NOTE  Subjective:  Catherine Hayes is a 24 y.o. G2P1 at [redacted]w[redacted]d being seen today for ongoing prenatal care.  She is currently monitored for the following issues for this high-risk pregnancy and has Supervision of normal first pregnancy; Cervical insufficiency during pregnancy in second trimester, antepartum; Cervical insufficiency in pregnancy, antepartum; Trichimoniasis; Gonorrhea affecting pregnancy; Chlamydia infection affecting pregnancy; Stillbirth of single fetus; Supervision of high risk pregnancy, antepartum; and ASCUS with positive high risk HPV cervical on their problem list.  Patient reports no complaints.  Contractions: Not present. Vag. Bleeding: None.  Movement: Present. Denies leaking of fluid.   The following portions of the patient's history were reviewed and updated as appropriate: allergies, current medications, past family history, past medical history, past social history, past surgical history and problem list.   Objective:   Vitals:   09/23/19 1120  BP: 113/68  Pulse: 74  Weight: 142 lb 3.2 oz (64.5 kg)    Fetal Status: Fetal Heart Rate (bpm): 140   Movement: Present     General:  Alert, oriented and cooperative. Patient is in no acute distress.  Skin: Skin is warm and dry. No rash noted.   Cardiovascular: Normal heart rate noted  Respiratory: Normal respiratory effort, no problems with respiration noted  Abdomen: Soft, gravid, appropriate for gestational age.  Pain/Pressure: Absent     Pelvic: Cervical exam deferred        Extremities: Normal range of motion.  Edema: None  Mental Status: Normal mood and affect. Normal behavior. Normal judgment and thought content.   Assessment and Plan:  Pregnancy: G2P1 at [redacted]w[redacted]d 1. Encounter for supervision of normal first pregnancy in second trimester screening - AFP, Serum, Open Spina Bifida  2. Cervical insufficiency in pregnancy, antepartum F/u US for cervical length  3. Supervision of high risk  pregnancy, antepartum H/o 20 week loss  4. ASCUS with positive high risk HPV cervical F/u pap 12 months  Preterm labor symptoms and general obstetric precautions including but not limited to vaginal bleeding, contractions, leaking of fluid and fetal movement were reviewed in detail with the patient. Please refer to After Visit Summary for other counseling recommendations.   No follow-ups on file.  Future Appointments  Date Time Provider Department Center  10/13/2019 11:15 AM [facility]  10/21/2019 11:00 AM Conan Bowens, MD [facility] None    Scheryl Darter, MD

## 2019-09-23 NOTE — Patient Instructions (Signed)

## 2019-09-24 NOTE — Progress Notes (Signed)
Cancelled appointment

## 2019-09-25 LAB — AFP, SERUM, OPEN SPINA BIFIDA
AFP MoM: 0.96
AFP Value: 47.1 ng/mL
Gest. Age on Collection Date: 17.7 weeks
Maternal Age At EDD: 24.4 yr
OSBR Risk 1 IN: 10000
Test Results:: NEGATIVE
Weight: 142 [lb_av]

## 2019-09-27 ENCOUNTER — Telehealth: Payer: Self-pay | Admitting: *Deleted

## 2019-09-27 NOTE — Telephone Encounter (Signed)
Pt called to after hour nurse line with questions about medications.  Attempt to return call. No answer, LVM to call if she still has questions.

## 2019-10-08 ENCOUNTER — Inpatient Hospital Stay (HOSPITAL_BASED_OUTPATIENT_CLINIC_OR_DEPARTMENT_OTHER): Payer: Medicaid Other

## 2019-10-08 ENCOUNTER — Other Ambulatory Visit: Payer: Self-pay

## 2019-10-08 ENCOUNTER — Inpatient Hospital Stay (HOSPITAL_COMMUNITY)
Admission: AD | Admit: 2019-10-08 | Discharge: 2019-10-08 | Disposition: A | Discharge status: Home / Self Care | Payer: Medicaid Other | Attending: Obstetrics & Gynecology | Admitting: Obstetrics & Gynecology

## 2019-10-08 ENCOUNTER — Encounter (HOSPITAL_COMMUNITY): Payer: Self-pay

## 2019-10-08 DIAGNOSIS — O2242 Hemorrhoids in pregnancy, second trimester: Secondary | ICD-10-CM | POA: Diagnosis not present

## 2019-10-08 DIAGNOSIS — R102 Pelvic and perineal pain: Secondary | ICD-10-CM | POA: Insufficient documentation

## 2019-10-08 DIAGNOSIS — O26892 Other specified pregnancy related conditions, second trimester: Secondary | ICD-10-CM

## 2019-10-08 DIAGNOSIS — Z7982 Long term (current) use of aspirin: Secondary | ICD-10-CM | POA: Insufficient documentation

## 2019-10-08 DIAGNOSIS — Z87891 Personal history of nicotine dependence: Secondary | ICD-10-CM | POA: Diagnosis not present

## 2019-10-08 DIAGNOSIS — R109 Unspecified abdominal pain: Secondary | ICD-10-CM | POA: Diagnosis not present

## 2019-10-08 DIAGNOSIS — Z79899 Other long term (current) drug therapy: Secondary | ICD-10-CM | POA: Insufficient documentation

## 2019-10-08 DIAGNOSIS — O09292 Supervision of pregnancy with other poor reproductive or obstetric history, second trimester: Secondary | ICD-10-CM

## 2019-10-08 DIAGNOSIS — R110 Nausea: Secondary | ICD-10-CM | POA: Insufficient documentation

## 2019-10-08 DIAGNOSIS — R197 Diarrhea, unspecified: Secondary | ICD-10-CM

## 2019-10-08 DIAGNOSIS — Z3A19 19 weeks gestation of pregnancy: Secondary | ICD-10-CM

## 2019-10-08 DIAGNOSIS — O99612 Diseases of the digestive system complicating pregnancy, second trimester: Secondary | ICD-10-CM | POA: Diagnosis not present

## 2019-10-08 DIAGNOSIS — K921 Melena: Secondary | ICD-10-CM | POA: Diagnosis not present

## 2019-10-08 LAB — URINALYSIS, ROUTINE W REFLEX MICROSCOPIC
Bilirubin Urine: NEGATIVE
Glucose, UA: NEGATIVE mg/dL
Hgb urine dipstick: NEGATIVE
Ketones, ur: NEGATIVE mg/dL
Leukocytes,Ua: NEGATIVE
Nitrite: NEGATIVE
Protein, ur: NEGATIVE mg/dL
Specific Gravity, Urine: 1.015 (ref 1.005–1.030)
pH: 6 (ref 5.0–8.0)

## 2019-10-08 NOTE — MAU Provider Note (Signed)
Chief Complaint: Abdominal Cramping, Diarrhea, and Blood In Stools   First Provider Initiated Contact with Patient 10/08/19 1449     SUBJECTIVE HPI: Catherine Hayes is a 24 y.o. G2P1 at 4543w6d who presents to [facility] reporting abdominal pain, diarrhea, & hemorrhoid.  Reports intermittent abdominal pain since yesterday. Occurs about 2-3 times per hour. Has had diarrhea since yesterday. Reports 3 episodes of loose/watery stools since this morning and countless episodes yesterday. Some nausea, no vomiting. No sick contacts.  Saw some blood on toilet paper after wiping her anus; states she has a hemorrhoid. No rectal pain.  Denies fever/chills, dysuria, vaginal bleeding, or LOF. History of 20 wk loss due to cervical incompetence.   Location: abdomen Quality: cramping, sharp Severity: 6/10 on pain scale Duration: 2 days Timing: 2-3 times per hour Modifying factors: none Associated signs and symptoms: nausea, diarrhea  Past Medical History:  Diagnosis Date  . Medical history non-contributory    OB History  Gravida Para Term Preterm AB Living  2 1       0  SAB TAB Ectopic Multiple Live Births        0      # Outcome Date GA Lbr Len/2nd Weight Sex Delivery Anes PTL Lv  2 Current           1 Para 08/17/15 424w1d 00:06 / 01:23 280 g F Vag-Spont None  FD   Past Surgical History:  Procedure Laterality Date  . ANTERIOR LAT LUMBAR FUSION N/A 08/03/2012   Procedure: ANTERIOR LATERAL LUMBAR L1 CORPECTOMY 1 LEVEL;  Surgeon: Catherine Alertavid S Jones, MD;  Location: [facility];  Service: Neurosurgery;  Laterality: N/A;  Anteriolateral lumbar one corpectomy, strut graft and plating  . WRIST SURGERY     Social History   Socioeconomic History  . Marital status: Single    Spouse name: Not on file  . Number of children: Not on file  . Years of education: Not on file  . Highest education level: Not on file  Occupational History  . Not on file  Tobacco Use  . Smoking status: Former Smoker     Types: Cigarettes    Quit date: 08/08/2019    Years since quitting: 0.1  . Smokeless tobacco: Never Used  . Tobacco comment: rarely  Substance and Sexual Activity  . Alcohol use: No  . Drug use: No  . Sexual activity: Never    Comment: 1 cigarette per day  Other Topics Concern  . Not on file  Social History Narrative  . Not on file   Social Determinants of Health   Financial Resource Strain:   . Difficulty of Paying Living Expenses:   Food Insecurity:   . Worried About Programme researcher, broadcasting/film/videounning Out of Food in the Last Year:   . Baristaan Out of Food in the Last Year:   Transportation Needs:   . Freight forwarderLack of Transportation (Medical):   Marland Kitchen. Lack of Transportation (Non-Medical):   Physical Activity:   . Days of Exercise per Week:   . Minutes of Exercise per Session:   Stress:   . Feeling of Stress :   Social Connections:   . Frequency of Communication with Friends and Family:   . Frequency of Social Gatherings with Friends and Family:   . Attends Religious Services:   . Active Member of Clubs or Organizations:   . Attends BankerClub or Organization Meetings:   Marland Kitchen. Marital Status:   Intimate Partner Violence:   . Fear of Current or Ex-Partner:   .  Emotionally Abused:   Marland Kitchen Physically Abused:   . Sexually Abused:    Family History  Problem Relation Age of Onset  . Diabetes Mother   . Hypertension Mother   . Diabetes Maternal Grandfather    No current facility-administered medications on file prior to encounter.   Current Outpatient Medications on File Prior to Encounter  Medication Sig Dispense Refill  . Prenatal Vit-Fe Fumarate-FA (PRENATAL COMPLETE) 14-0.4 MG TABS Take 1 tablet by mouth daily. 30 tablet 0  . acetaminophen (TYLENOL) 500 MG tablet Take 1,000 mg by mouth every 6 (six) hours as needed for mild pain, moderate pain or headache.     Marland Kitchen aspirin 81 MG chewable tablet Chew 1 tablet (81 mg total) by mouth daily. 30 tablet 6   No Known Allergies  I have reviewed patient's Past Medical Hx, Surgical  Hx, Family Hx, Social Hx, medications and allergies.   Review of Systems  Constitutional: Negative.   Gastrointestinal: Positive for abdominal pain, anal bleeding, diarrhea and nausea. Negative for blood in stool, constipation, rectal pain and vomiting.  Genitourinary: Negative.     OBJECTIVE Patient Vitals for the past 24 hrs:  BP Temp Temp src Pulse Resp SpO2 Height Weight  10/08/19 1343 123/69 -- -- 72 -- 100 % -- --  10/08/19 1241 121/64 98 F (36.7 C) Oral 81 20 100 % 5\' 7"  (1.702 m) 65.8 kg   Constitutional: Well-developed, well-nourished female in no acute distress.  Cardiovascular: normal rate & rhythm, no murmur Respiratory: normal rate and effort. Lung sounds clear throughout GI: Small flesh colored external hemorrhoid, ~68mm, no blood. Abd soft, non-tender, Pos BS x 4. No guarding or rebound tenderness MS: Extremities nontender, no edema, normal ROM Neurologic: Alert and oriented x 4.  GU:  NEFG. No blood. Cervix closed/thick.    LAB RESULTS Results for orders placed or performed during the hospital encounter of 10/08/19 (from the past 24 hour(s))  Urinalysis, Routine w reflex microscopic     Status: None   Collection Time: 10/08/19  1:54 PM  Result Value Ref Range   Color, Urine YELLOW YELLOW   APPearance CLEAR CLEAR   Specific Gravity, Urine 1.015 1.005 - 1.030   pH 6.0 5.0 - 8.0   Glucose, UA NEGATIVE NEGATIVE mg/dL   Hgb urine dipstick NEGATIVE NEGATIVE   Bilirubin Urine NEGATIVE NEGATIVE   Ketones, ur NEGATIVE NEGATIVE mg/dL   Protein, ur NEGATIVE NEGATIVE mg/dL   Nitrite NEGATIVE NEGATIVE   Leukocytes,Ua NEGATIVE NEGATIVE    IMAGING 12/08/19 MFM OB Transvaginal  Result Date: 10/08/2019 ----------------------------------------------------------------------  OBSTETRICS REPORT                       (Signed Final 10/08/2019 04:16 pm) ---------------------------------------------------------------------- Patient Info  ID #:       12/08/2019                           D.O.B.:  Jun 18, 1995 (24 yrs)  Name:       [birth year]                   Visit Date: 10/08/2019 03:04 pm ---------------------------------------------------------------------- Performed By  Attending:        12/08/2019 MD         Ref. Address:     [address]                                                             [place], [place]                                                             [address]  Performed By:     Hurman Horn          [facility]  Referred By:      Brock Bad MD ---------------------------------------------------------------------- Orders  #  Description                           Code        Ordered By  1  [place] MFM OB TRANSVAGINAL                [id number]     Judeth Horn ----------------------------------------------------------------------  #  Order #                     Accession #                Episode #  1  [id number]                   [id number]                 [id number] ---------------------------------------------------------------------- Indications  Pelvic pain affecting pregnancy in second      O26.892  trimester  [redacted] weeks gestation of pregnancy                Z3A.19  Poor obstetric history: Previous IUFD          O9.299  (stillbirth) @ 20w ---------------------------------------------------------------------- Fetal Evaluation  Num Of Fetuses:         1  Fetal Heart Rate(bpm):  138  Cardiac Activity:       Observed  Presentation:           Breech  Placenta:               Right lateral  P. Cord Insertion:      Visualized  Amniotic Fluid  AFI FV:      Within normal limits                              Largest Pocket(cm)  6.9  Comment:    Stomach, bladder, and diaphragm noted. No placental abruption              or previa identified.  ---------------------------------------------------------------------- OB History  Gravidity:    2         Term:   0         SAB:   1  Living:       0 ---------------------------------------------------------------------- Gestational Age  LMP:           17w 3d        Date:  06/08/19                 EDD:   03/14/20  Best:          19w 6d     Det. ByMarcella Dubs         EDD:   02/26/20                                      (07/02/19) ---------------------------------------------------------------------- Cervix Uterus Adnexa  Cervix  Length:            4.5  cm.  Normal appearance by transvaginal scan  Uterus  No abnormality visualized.  Right Ovary  Within normal limits.  Left Ovary  Not visualized.  Adnexa  No abnormality visualized. ---------------------------------------------------------------------- Comments  A transvaginal ultrasound performed today shows that her  cervical length is 4.5 cm long without any signs of funneling.  Normal amniotic fluid is noted today.  The fetus was in the breech presentation. ----------------------------------------------------------------------                   Ma Rings, MD Electronically Signed Final Report   10/08/2019 04:16 pm ----------------------------------------------------------------------   MAU COURSE Orders Placed This Encounter  Procedures  . [place] MFM OB Transvaginal  . Urinalysis, Routine w reflex microscopic  . Contact Isolation: Enteric  . Discharge patient   No orders of the defined types were placed in this encounter.   MDM FHT present via doppler Cervix closed U/a without evidence of dehydration or infection  TVUS, cervical length 4.5 cm  Discussed tx of diarrhea and hemorrhoids at home. Very small external hemorrhoid on exam, no bleeding, no evidence of thrombosis.   ASSESSMENT 1. Abdominal pain during pregnancy in second trimester   2. [redacted] weeks gestation of pregnancy   3. Hx of cervical incompetence in pregnancy, currently  pregnant, second trimester   4. Hemorrhoids during pregnancy in second trimester   5. Diarrhea during pregnancy     PLAN Discharge home in stable condition. PTL precautions   Allergies as of 10/08/2019   No Known Allergies     Medication List    STOP taking these medications   Blood Pressure Cuff Misc   Doxylamine-Pyridoxine 10-10 MG Tbec Commonly known as: Diclegis   loratadine 10 MG tablet Commonly known as: Claritin   metoCLOPramide 10 MG tablet Commonly known as: REGLAN   promethazine 25 MG tablet Commonly known as: PHENERGAN   tinidazole 500 MG tablet Commonly known as: Tindamax   Vitafol Ultra 29-0.6-0.4-200 MG Caps     TAKE these medications   acetaminophen 500 MG tablet Commonly known as: TYLENOL Take 1,000 mg by mouth every 6 (six) hours as needed for mild pain, moderate pain or headache.   aspirin 81 MG chewable tablet Chew 1 tablet (81 mg total) by mouth  daily.   Prenatal Complete 14-0.4 MG Tabs Take 1 tablet by mouth daily.        Jorje Guild, NP 10/08/2019  4:19 PM

## 2019-10-08 NOTE — MAU Note (Signed)
abd pain/cramping and "sharp pain inside the uterus", started 2 days ago.  Diarrhea about the same time, noted blood in the stool,  And when she wipes.  Has a know hemorrhoid.

## 2019-10-08 NOTE — Discharge Instructions (Signed)
Diarrhea, Adult °Diarrhea is frequent loose and watery bowel movements. Diarrhea can make you feel weak and cause you to become dehydrated. Dehydration can make you tired and thirsty, cause you to have a dry mouth, and decrease how often you urinate. °Diarrhea typically lasts 2-3 days. However, it can last longer if it is a sign of something more serious. It is important to treat your diarrhea as told by your health care provider. °Follow these instructions at home: °Eating and drinking ° °  ° °Follow these recommendations as told by your health care provider: °· Take an oral rehydration solution (ORS). This is an over-the-counter medicine that helps return your body to its normal balance of nutrients and water. It is found at pharmacies and retail stores. °· Drink plenty of fluids, such as water, ice chips, diluted fruit juice, and low-calorie sports drinks. You can drink milk also, if desired. °· Avoid drinking fluids that contain a lot of sugar or caffeine, such as energy drinks, sports drinks, and soda. °· Eat bland, easy-to-digest foods in small amounts as you are able. These foods include bananas, applesauce, rice, lean meats, toast, and crackers. °· Avoid alcohol. °· Avoid spicy or fatty foods. ° °Medicines °· Take over-the-counter and prescription medicines only as told by your health care provider. °· If you were prescribed an antibiotic medicine, take it as told by your health care provider. Do not stop using the antibiotic even if you start to feel better. °General instructions ° °· Wash your hands often using soap and water. If soap and water are not available, use a hand sanitizer. Others in the household should wash their hands as well. Hands should be washed: °? After using the toilet or changing a diaper. °? Before preparing, cooking, or serving food. °? While caring for a sick person or while visiting someone in a hospital. °· Drink enough fluid to keep your urine pale yellow. °· Rest at home while  you recover. °· Watch your condition for any changes. °· Take a warm bath to relieve any burning or pain from frequent diarrhea episodes. °· Keep all follow-up visits as told by your health care provider. This is important. °Contact a health care provider if: °· You have a fever. °· Your diarrhea gets worse. °· You have new symptoms. °· You cannot keep fluids down. °· You feel light-headed or dizzy. °· You have a headache. °· You have muscle cramps. °Get help right away if: °· You have chest pain. °· You feel extremely weak or you faint. °· You have bloody or black stools or stools that look like tar. °· You have severe pain, cramping, or bloating in your abdomen. °· You have trouble breathing or you are breathing very quickly. °· Your heart is beating very quickly. °· Your skin feels cold and clammy. °· You feel confused. °· You have signs of dehydration, such as: °? Dark urine, very little urine, or no urine. °? Cracked lips. °? Dry mouth. °? Sunken eyes. °? Sleepiness. °? Weakness. °Summary °· Diarrhea is frequent loose and watery bowel movements. Diarrhea can make you feel weak and cause you to become dehydrated. °· Drink enough fluids to keep your urine pale yellow. °· Make sure that you wash your hands after using the toilet. If soap and water are not available, use hand sanitizer. °· Contact a health care provider if your diarrhea gets worse or you have new symptoms. °· Get help right away if you have signs of dehydration. °This   information is not intended to replace advice given to you by your health care provider. Make sure you discuss any questions you have with your health care provider. Document Revised: 09/08/2018 Document Reviewed: 09/26/2017 Elsevier Patient Education  2020 [employer]. Hemorrhoids Hemorrhoids are swollen veins in and around the rectum or anus. There are two types of hemorrhoids:  Internal hemorrhoids. These occur in the veins that are just inside the rectum. They may poke  through to the outside and become irritated and painful.  External hemorrhoids. These occur in the veins that are outside the anus and can be felt as a painful swelling or hard lump near the anus. Most hemorrhoids do not cause serious problems, and they can be managed with home treatments such as diet and lifestyle changes. If home treatments do not help the symptoms, procedures can be done to shrink or remove the hemorrhoids. What are the causes? This condition is caused by increased pressure in the anal area. This pressure may result from various things, including:  Constipation.  Straining to have a bowel movement.  Diarrhea.  Pregnancy.  Obesity.  Sitting for long periods of time.  Heavy lifting or other activity that causes you to strain.  Anal sex.  Riding a bike for a long period of time. What are the signs or symptoms? Symptoms of this condition include:  Pain.  Anal itching or irritation.  Rectal bleeding.  Leakage of stool (feces).  Anal swelling.  One or more lumps around the anus. How is this diagnosed? This condition can often be diagnosed through a visual exam. Other exams or tests may also be done, such as:  An exam that involves feeling the rectal area with a gloved hand (digital rectal exam).  An exam of the anal canal that is done using a small tube (anoscope).  A blood test, if you have lost a significant amount of blood.  A test to look inside the colon using a flexible tube with a camera on the end (sigmoidoscopy or colonoscopy). How is this treated? This condition can usually be treated at home. However, various procedures may be done if dietary changes, lifestyle changes, and other home treatments do not help your symptoms. These procedures can help make the hemorrhoids smaller or remove them completely. Some of these procedures involve surgery, and others do not. Common procedures include:  Rubber band ligation. Rubber bands are placed at the  base of the hemorrhoids to cut off their blood supply.  Sclerotherapy. Medicine is injected into the hemorrhoids to shrink them.  Infrared coagulation. A type of light energy is used to get rid of the hemorrhoids.  Hemorrhoidectomy surgery. The hemorrhoids are surgically removed, and the veins that supply them are tied off.  Stapled hemorrhoidopexy surgery. The surgeon staples the base of the hemorrhoid to the rectal wall. Follow these instructions at home: Eating and drinking   Eat foods that have a lot of fiber in them, such as whole grains, beans, nuts, fruits, and vegetables.  Ask your health care provider about taking products that have added fiber (fiber supplements).  Reduce the amount of fat in your diet. You can do this by eating low-fat dairy products, eating less red meat, and avoiding processed foods.  Drink enough fluid to keep your urine pale yellow. Managing pain and swelling   Take warm sitz baths for 20 minutes, 3-4 times a day to ease pain and discomfort. You may do this in a bathtub or using a portable sitz bath  that fits over the toilet.  If directed, apply ice to the affected area. Using ice packs between sitz baths may be helpful. ? Put ice in a plastic bag. ? Place a towel between your skin and the bag. ? Leave the ice on for 20 minutes, 2-3 times a day. General instructions  Take over-the-counter and prescription medicines only as told by your health care provider.  Use medicated creams or suppositories as told.  Get regular exercise. Ask your health care provider how much and what kind of exercise is best for you. In general, you should do moderate exercise for at least 30 minutes on most days of the week (150 minutes each week). This can include activities such as walking, biking, or yoga.  Go to the bathroom when you have the urge to have a bowel movement. Do not wait.  Avoid straining to have bowel movements.  Keep the anal area dry and clean. Use  wet toilet paper or moist towelettes after a bowel movement.  Do not sit on the toilet for long periods of time. This increases blood pooling and pain.  Keep all follow-up visits as told by your health care provider. This is important. Contact a health care provider if you have:  Increasing pain and swelling that are not controlled by treatment or medicine.  Difficulty having a bowel movement, or you are unable to have a bowel movement.  Pain or inflammation outside the area of the hemorrhoids. Get help right away if you have:  Uncontrolled bleeding from your rectum. Summary  Hemorrhoids are swollen veins in and around the rectum or anus.  Most hemorrhoids can be managed with home treatments such as diet and lifestyle changes.  Taking warm sitz baths can help ease pain and discomfort.  In severe cases, procedures or surgery can be done to shrink or remove the hemorrhoids. This information is not intended to replace advice given to you by your health care provider. Make sure you discuss any questions you have with your health care provider. Document Revised: 09/18/2018 Document Reviewed: 09/11/2017 Elsevier Patient Education  [place].

## 2019-10-08 NOTE — ED Provider Notes (Signed)
MSE was initiated and I personally evaluated the patient and placed orders (if any) at  1:13 PM on October 08, 2019.  The patient appears stable so that the remainder of the MSE may be completed by another provider.  [redacted]w[redacted]d gestation, G87P0. Preterm labor and miscarriage around same time of pregnancy. Complaining of 3 days of intermitted lower abdominal cramping worse than menstrual cramps, with sharp pelvic pains. No leakage of fluid or bleeding, though endorses increased discharge. Formal OB care Dr. Clearance Coots at [facility] on [address].   MAU provider accepting transfer.   Darinda [place], [place] N, PA-C 10/08/19 1426    Terrilee Files, MD 10/08/19 [phone]

## 2019-10-08 NOTE — ED Triage Notes (Signed)
Pt arrives POV for eval of lower abd pain/cramping. Pt reports she is [redacted]w[redacted]d w/ 2nd preg. States last pregnancy she had preterm labor and lost the baby. Pt denies vag bleeding, does endorse cramping x 2 days w/ back pain. States also w/ diarrhea last 2-3 days w/ some blood when she wipes. Denies N/V.

## 2019-10-13 ENCOUNTER — Other Ambulatory Visit: Payer: Self-pay | Admitting: *Deleted

## 2019-10-13 ENCOUNTER — Other Ambulatory Visit: Payer: Self-pay

## 2019-10-13 ENCOUNTER — Ambulatory Visit: Payer: Medicaid Other | Attending: Obstetrics and Gynecology

## 2019-10-13 ENCOUNTER — Ambulatory Visit: Payer: Medicaid Other | Admitting: *Deleted

## 2019-10-13 VITALS — BP 115/73 | HR 82

## 2019-10-13 DIAGNOSIS — O09292 Supervision of pregnancy with other poor reproductive or obstetric history, second trimester: Secondary | ICD-10-CM | POA: Diagnosis not present

## 2019-10-13 DIAGNOSIS — O358XX0 Maternal care for other (suspected) fetal abnormality and damage, not applicable or unspecified: Secondary | ICD-10-CM | POA: Diagnosis not present

## 2019-10-13 DIAGNOSIS — O26892 Other specified pregnancy related conditions, second trimester: Secondary | ICD-10-CM

## 2019-10-13 DIAGNOSIS — Z3A20 20 weeks gestation of pregnancy: Secondary | ICD-10-CM | POA: Diagnosis not present

## 2019-10-13 DIAGNOSIS — O09892 Supervision of other high risk pregnancies, second trimester: Secondary | ICD-10-CM | POA: Diagnosis not present

## 2019-10-13 DIAGNOSIS — Z8759 Personal history of other complications of pregnancy, childbirth and the puerperium: Secondary | ICD-10-CM | POA: Diagnosis not present

## 2019-10-13 DIAGNOSIS — Z362 Encounter for other antenatal screening follow-up: Secondary | ICD-10-CM

## 2019-10-21 ENCOUNTER — Encounter: Payer: Self-pay | Admitting: Obstetrics and Gynecology

## 2019-10-21 ENCOUNTER — Other Ambulatory Visit: Payer: Self-pay

## 2019-10-21 ENCOUNTER — Ambulatory Visit (INDEPENDENT_AMBULATORY_CARE_PROVIDER_SITE_OTHER): Payer: Medicaid Other | Admitting: Obstetrics and Gynecology

## 2019-10-21 VITALS — BP 118/73 | HR 84 | Wt 153.8 lb

## 2019-10-21 DIAGNOSIS — O3442 Maternal care for other abnormalities of cervix, second trimester: Secondary | ICD-10-CM

## 2019-10-21 DIAGNOSIS — O0992 Supervision of high risk pregnancy, unspecified, second trimester: Secondary | ICD-10-CM

## 2019-10-21 DIAGNOSIS — R87610 Atypical squamous cells of undetermined significance on cytologic smear of cervix (ASC-US): Secondary | ICD-10-CM

## 2019-10-21 DIAGNOSIS — O0990 Supervision of high risk pregnancy, unspecified, unspecified trimester: Secondary | ICD-10-CM

## 2019-10-21 DIAGNOSIS — Z3A21 21 weeks gestation of pregnancy: Secondary | ICD-10-CM

## 2019-10-21 DIAGNOSIS — K625 Hemorrhage of anus and rectum: Secondary | ICD-10-CM

## 2019-10-21 DIAGNOSIS — O3432 Maternal care for cervical incompetence, second trimester: Secondary | ICD-10-CM

## 2019-10-21 DIAGNOSIS — R87810 Cervical high risk human papillomavirus (HPV) DNA test positive: Secondary | ICD-10-CM

## 2019-10-21 NOTE — Progress Notes (Signed)
Patient reports fetal movement, denies pain. Pt reports that she has been having regular bowel movements daily, but has been seeing blood in her stool.

## 2019-10-21 NOTE — Progress Notes (Signed)
   PRENATAL VISIT NOTE  Subjective:  Catherine Hayes is a 24 y.o. G2P1 at [redacted]w[redacted]d being seen today for ongoing prenatal care.  She is currently monitored for the following issues for this high-risk pregnancy and has Cervical insufficiency during pregnancy in second trimester, antepartum; Cervical insufficiency in pregnancy, antepartum; Trichimoniasis; Gonorrhea affecting pregnancy; Chlamydia infection affecting pregnancy; Stillbirth of single fetus; Supervision of high risk pregnancy, antepartum; and ASCUS with positive high risk HPV cervical on their problem list.  Patient reports BM with blood in it this am. Was seeing blood on tissue yesterday, she is sure that it is rectal. Had some bright red blood this am just prior to stooling, feels like it was a lot. Is positive that it is rectal, not vaginal. Contractions: Not present. Vag. Bleeding: None.  Movement: Present. Denies leaking of fluid.   The following portions of the patient's history were reviewed and updated as appropriate: allergies, current medications, past family history, past medical history, past social history, past surgical history and problem list.   Objective:   Vitals:   10/21/19 1114  BP: 118/73  Pulse: 84  Weight: 153 lb 12.8 oz (69.8 kg)    Fetal Status: Fetal Heart Rate (bpm): 134   Movement: Present     General:  Alert, oriented and cooperative. Patient is in no acute distress.  Skin: Skin is warm and dry. No rash noted.   Cardiovascular: Normal heart rate noted  Respiratory: Normal respiratory effort, no problems with respiration noted  Abdomen: Soft, gravid, appropriate for gestational age.  Pain/Pressure: Absent     Pelvic: Cervical exam deferred        Extremities: Normal range of motion.     Mental Status: Normal mood and affect. Normal behavior. Normal judgment and thought content.   Assessment and Plan:  Pregnancy: G2P1 at [redacted]w[redacted]d  1. ASCUS with positive high risk HPV cervical Repeat pap  2. Supervision  of high risk pregnancy, antepartum  3. Cervical insufficiency during pregnancy in second trimester, antepartum Declined cerclage Regular TVUS with MFM  4. Rectal bleeding - Self-limited this am, bright red bleeding - she is sure it is rectal - to hospital if heavy bleeding -likely hemorrhoid given painless - Ambulatory referral to Gastroenterology  [place] of [place] 2 [place]. Will cont care until move.   Preterm labor symptoms and general obstetric precautions including but not limited to vaginal bleeding, contractions, leaking of fluid and fetal movement were reviewed in detail with the patient. Please refer to After Visit Summary for other counseling recommendations.   Return in about 4 weeks (around 11/18/2019) for high OB, virtual.  Future Appointments  Date Time Provider Department Center  11/11/2019 11:15 AM [facility] NURSE [facility]  11/11/2019 11:15 AM [facility] US2 [facility]    Conan Bowens, MD

## 2019-11-11 ENCOUNTER — Ambulatory Visit: Payer: Medicaid Other | Admitting: *Deleted

## 2019-11-11 ENCOUNTER — Other Ambulatory Visit: Payer: Self-pay

## 2019-11-11 ENCOUNTER — Ambulatory Visit: Payer: Medicaid Other | Attending: Obstetrics and Gynecology

## 2019-11-11 VITALS — BP 113/70 | HR 83

## 2019-11-11 DIAGNOSIS — O0990 Supervision of high risk pregnancy, unspecified, unspecified trimester: Secondary | ICD-10-CM

## 2019-11-11 DIAGNOSIS — Z362 Encounter for other antenatal screening follow-up: Secondary | ICD-10-CM

## 2019-11-11 DIAGNOSIS — O26892 Other specified pregnancy related conditions, second trimester: Secondary | ICD-10-CM

## 2019-11-11 DIAGNOSIS — Z3A24 24 weeks gestation of pregnancy: Secondary | ICD-10-CM

## 2019-11-11 DIAGNOSIS — O09292 Supervision of pregnancy with other poor reproductive or obstetric history, second trimester: Secondary | ICD-10-CM

## 2019-11-11 DIAGNOSIS — O358XX0 Maternal care for other (suspected) fetal abnormality and damage, not applicable or unspecified: Secondary | ICD-10-CM | POA: Diagnosis not present

## 2019-11-16 ENCOUNTER — Encounter (HOSPITAL_COMMUNITY): Payer: Self-pay | Admitting: Obstetrics and Gynecology

## 2019-11-16 ENCOUNTER — Other Ambulatory Visit: Payer: Self-pay

## 2019-11-16 ENCOUNTER — Inpatient Hospital Stay (HOSPITAL_COMMUNITY)
Admission: AD | Admit: 2019-11-16 | Discharge: 2019-11-16 | Disposition: A | Discharge status: Home / Self Care | Payer: Medicaid Other | Attending: Obstetrics and Gynecology | Admitting: Obstetrics and Gynecology

## 2019-11-16 DIAGNOSIS — Z79899 Other long term (current) drug therapy: Secondary | ICD-10-CM | POA: Insufficient documentation

## 2019-11-16 DIAGNOSIS — Z87891 Personal history of nicotine dependence: Secondary | ICD-10-CM | POA: Diagnosis not present

## 2019-11-16 DIAGNOSIS — Z3A25 25 weeks gestation of pregnancy: Secondary | ICD-10-CM

## 2019-11-16 DIAGNOSIS — O2242 Hemorrhoids in pregnancy, second trimester: Secondary | ICD-10-CM

## 2019-11-16 DIAGNOSIS — Z7982 Long term (current) use of aspirin: Secondary | ICD-10-CM | POA: Insufficient documentation

## 2019-11-16 DIAGNOSIS — Z3689 Encounter for other specified antenatal screening: Secondary | ICD-10-CM

## 2019-11-16 LAB — CBC
HCT: 31.2 % — ABNORMAL LOW (ref 36.0–46.0)
Hemoglobin: 10.4 g/dL — ABNORMAL LOW (ref 12.0–15.0)
MCH: 31.4 pg (ref 26.0–34.0)
MCHC: 33.3 g/dL (ref 30.0–36.0)
MCV: 94.3 fL (ref 80.0–100.0)
Platelets: 256 10*3/uL (ref 150–400)
RBC: 3.31 MIL/uL — ABNORMAL LOW (ref 3.87–5.11)
RDW: 12.5 % (ref 11.5–15.5)
WBC: 9 10*3/uL (ref 4.0–10.5)
nRBC: 0 % (ref 0.0–0.2)

## 2019-11-16 LAB — URINALYSIS, ROUTINE W REFLEX MICROSCOPIC
Bilirubin Urine: NEGATIVE
Glucose, UA: NEGATIVE mg/dL
Ketones, ur: NEGATIVE mg/dL
Leukocytes,Ua: NEGATIVE
Nitrite: NEGATIVE
Protein, ur: NEGATIVE mg/dL
Specific Gravity, Urine: 1.024 (ref 1.005–1.030)
pH: 6 (ref 5.0–8.0)

## 2019-11-16 MED ORDER — HEMORRHOIDAL 0.25-14-71.9 % RE OINT: 1.0000 "application " | TOPICAL_OINTMENT | Freq: Two times a day (BID) | RECTAL | 0 refills | Status: AC | PRN

## 2019-11-16 MED ORDER — DOCUSATE SODIUM 100 MG PO CAPS
100.0000 mg | ORAL_CAPSULE | Freq: Two times a day (BID) | ORAL | 0 refills | Status: AC
Start: 2019-11-16 — End: ?

## 2019-11-16 NOTE — MAU Note (Signed)
Seeing a lot of blood when she uses the restroom, 'it is coming from her butt'.  Happens even when she is not having a bm.  Has been going on for a couple months.  Has talked to her drs, they said it might be hemorrhoids- but haven't looked. The amt of blood has increased. Denies rectal pain.  Minor pain in lower abd.

## 2019-11-16 NOTE — MAU Provider Note (Addendum)
History     CSN: [id number]  Arrival date and time: 11/16/19 1613   None     Chief Complaint  Patient presents with  . Abdominal Pain  . Rectal Bleeding   HPI Catherine Hayes is a 24 y.o. G2P1 @ [redacted]w[redacted]d gestation who presents to the MAU with c/o rectal bleeding. She reports that the symptoms started a few weeks ago and has been intermittent. She denies abdominal pain or vaginal bleeding. The patient was evaluated 10/08/19 for diarrhea, abdominal pain and hemorrhoids. She reports mild pain with passing stool.   OB History    Gravida  2   Para  1   Term      Preterm      AB      Living  0     SAB      TAB      Ectopic      Multiple  0   Live Births              Past Medical History:  Diagnosis Date  . Medical history non-contributory     Past Surgical History:  Procedure Laterality Date  . ANTERIOR LAT LUMBAR FUSION N/A 08/03/2012   Procedure: ANTERIOR LATERAL LUMBAR L1 CORPECTOMY 1 LEVEL;  Surgeon: Tia Alert, MD;  Location: [facility];  Service: Neurosurgery;  Laterality: N/A;  Anteriolateral lumbar one corpectomy, strut graft and plating  . WRIST SURGERY      Family History  Problem Relation Age of Onset  . Diabetes Mother   . Hypertension Mother   . Diabetes Maternal Grandfather     Social History   Tobacco Use  . Smoking status: Former Smoker    Types: Cigarettes    Quit date: 08/08/2019    Years since quitting: 0.2  . Smokeless tobacco: Never Used  . Tobacco comment: rarely  Vaping Use  . Vaping Use: Former  . Substances: Flavoring  Substance Use Topics  . Alcohol use: No  . Drug use: No    Allergies: No Known Allergies  Medications Prior to Admission  Medication Sig Dispense Refill Last Dose  . Prenatal Vit-Fe Fumarate-FA (PRENATAL COMPLETE) 14-0.4 MG TABS Take 1 tablet by mouth daily. 30 tablet 0 11/15/2019 at Unknown time  . acetaminophen (TYLENOL) 500 MG tablet Take 1,000 mg by mouth every 6 (six) hours as needed for mild  pain, moderate pain or headache.    Unknown at Unknown time  . aspirin 81 MG chewable tablet Chew 1 tablet (81 mg total) by mouth daily. (Patient not taking: Reported on 10/13/2019) 30 tablet 6     Review of Systems  Constitutional: Negative for chills and fever.  Gastrointestinal: Positive for blood in stool and rectal pain. Negative for abdominal pain.  Genitourinary: Negative for vaginal bleeding.  Skin: Negative for color change.  Neurological: Negative for light-headedness.   Physical Exam   Blood pressure (!) 129/59, pulse 75, temperature 98.5 F (36.9 C), temperature source Oral, resp. rate 16, weight 68.9 kg, last menstrual period 06/08/2019.  Physical Exam Vitals and nursing note reviewed.  Constitutional:      General: She is not in acute distress.    Appearance: She is well-developed.  HENT:     Head: Normocephalic and atraumatic.     Mouth/Throat:     Mouth: Mucous membranes are moist.  Cardiovascular:     Rate and Rhythm: Normal rate.  Pulmonary:     Effort: Pulmonary effort is normal.  Abdominal:  Palpations: Abdomen is soft.     Tenderness: There is no abdominal tenderness.     Comments: Gravid c/w gestation.  Genitourinary:    Vagina: Normal.     Rectum: External hemorrhoid present. No mass, tenderness or anal fissure. Normal anal tone.     Comments: No bright red blood noted.  Musculoskeletal:        General: Normal range of motion.     Cervical back: Neck supple.  Skin:    General: Skin is warm and dry.  Neurological:     Mental Status: She is alert and oriented to person, place, and time.  Psychiatric:        Behavior: Behavior normal.        Thought Content: Thought content normal.        Judgment: Judgment normal.     MAU Course  Procedures  MDM 24 y.o. female @ [redacted]w[redacted]d gestation here with c/o blood in stools. External hemorrhoid noted. No active bleeding noted. Discussed stool softener, diet and warm sitz baths.  CBC pending. Fetal  monitoring. If CBC is normal and Fetal monitor tracing is normal, will plan to d/c patient with above information in d/c instructions.   Assessment and Plan    [facility] 11/16/2019, 4:53 PM   External Hemorrhoids 25 weeks Cat I fT  Reassessment (6:44 PM) -CBC returns without significant findings. -Patient informed of daily stool softener usage and hemorrhoid cream/ointment. Scripts sent to pharmacy. -Instructed to keep next appt as scheduled. -Patient without questions or concerns. -NST Reactive.  -Encouraged to call or return to MAU if symptoms worsen or with the onset of new symptoms. -Discharged to home in stable condition.  Cherre Robins MSN, CNM Advanced Practice Provider, [facility] [employer]

## 2019-11-16 NOTE — Discharge Instructions (Signed)

## 2019-11-18 ENCOUNTER — Other Ambulatory Visit: Payer: Self-pay

## 2019-11-18 ENCOUNTER — Ambulatory Visit (INDEPENDENT_AMBULATORY_CARE_PROVIDER_SITE_OTHER): Payer: Medicaid Other | Admitting: Family Medicine

## 2019-11-18 VITALS — BP 123/76 | HR 80 | Wt 153.0 lb

## 2019-11-18 DIAGNOSIS — O0990 Supervision of high risk pregnancy, unspecified, unspecified trimester: Secondary | ICD-10-CM

## 2019-11-18 DIAGNOSIS — R87810 Cervical high risk human papillomavirus (HPV) DNA test positive: Secondary | ICD-10-CM

## 2019-11-18 DIAGNOSIS — O0992 Supervision of high risk pregnancy, unspecified, second trimester: Secondary | ICD-10-CM | POA: Diagnosis not present

## 2019-11-18 DIAGNOSIS — R87610 Atypical squamous cells of undetermined significance on cytologic smear of cervix (ASC-US): Secondary | ICD-10-CM

## 2019-11-18 DIAGNOSIS — Z3A25 25 weeks gestation of pregnancy: Secondary | ICD-10-CM | POA: Diagnosis not present

## 2019-11-18 DIAGNOSIS — O3432 Maternal care for cervical incompetence, second trimester: Secondary | ICD-10-CM

## 2019-11-18 DIAGNOSIS — O2242 Hemorrhoids in pregnancy, second trimester: Secondary | ICD-10-CM | POA: Diagnosis not present

## 2019-11-18 NOTE — Progress Notes (Signed)
Subjective:  Catherine Hayes is a 24 y.o. G2P1 at [redacted]w[redacted]d being seen today for ongoing prenatal care.  She is currently monitored for the following issues for this high-risk pregnancy and has Cervical insufficiency during pregnancy in second trimester, antepartum; Trichimoniasis; Gonorrhea affecting pregnancy; Chlamydia infection affecting pregnancy; Stillbirth of single fetus; Supervision of high risk pregnancy, antepartum; ASCUS with positive high risk HPV cervical; and Hemorrhoids during pregnancy in second trimester on their problem list.  Patient reports concerned about cervical length.  Contractions: Not present. Vag. Bleeding: None.  Movement: Present. Denies leaking of fluid.   The following portions of the patient's history were reviewed and updated as appropriate: allergies, current medications, past family history, past medical history, past social history, past surgical history and problem list. Problem list updated.  Objective:   Vitals:   11/18/19 1317  BP: 123/76  Pulse: 80  Weight: 153 lb (69.4 kg)    Fetal Status: Fetal Heart Rate (bpm): 140   Movement: Present     General:  Alert, oriented and cooperative. Patient is in no acute distress.  Skin: Skin is warm and dry. No rash noted.   Cardiovascular: Normal heart rate noted  Respiratory: Normal respiratory effort, no problems with respiration noted  Abdomen: Soft, gravid, appropriate for gestational age. Pain/Pressure: Present     Pelvic: Vag. Bleeding: None     Cervical exam performed Dilation: 1 Effacement (%): Thick Station: Ballotable  Extremities: Normal range of motion.     Mental Status: Normal mood and affect. Normal behavior. Normal judgment and thought content.     Assessment and Plan:  Pregnancy: G2P1 at [redacted]w[redacted]d  1. 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 ASCUS with positive high risk HPV  cervical -repeat Pap May 2022  3. Cervical insufficiency during pregnancy in second trimester, antepartum - declined cerclage - cervical length on [place] 7/9 was 3.9 cm  4. Hemorrhoids during pregnancy in second trimester - treated in MAU 7/13  Preterm labor symptoms and general obstetric precautions including but not limited to vaginal bleeding, contractions, leaking of fluid and fetal movement were reviewed in detail with the patient. Please refer to After Visit Summary for other counseling recommendations.  Return in about 3 weeks (around 12/09/2019) for HROB, 28 week labs, in person.   Catherine Hayes L, DO

## 2019-11-18 NOTE — Progress Notes (Signed)
Please discuss fetal movements in pregnancy. Pt states she is having pelvic pain/pressure.

## 2019-11-18 NOTE — Patient Instructions (Signed)
Fetal Movement Counts Patient Name: ________________________________________________ Patient Due Date: ____________________ What is a fetal movement count?  A fetal movement count is the number of times that you feel your baby move during a certain amount of time. This may also be called a fetal kick count. A fetal movement count is recommended for every pregnant woman. You may be asked to start counting fetal movements as early as week 28 of your pregnancy. Pay attention to when your baby is most active. You may notice your baby's sleep and wake cycles. You may also notice things that make your baby move more. You should do a fetal movement count:  When your baby is normally most active.  At the same time each day. A good time to count movements is while you are resting, after having something to eat and drink. How do I count fetal movements? 1. Find a quiet, comfortable area. Sit, or lie down on your side. 2. Write down the date, the start time and stop time, and the number of movements that you felt between those two times. Take this information with you to your health care visits. 3. Write down your start time when you feel the first movement. 4. Count kicks, flutters, swishes, rolls, and jabs. You should feel at least 10 movements. 5. You may stop counting after you have felt 10 movements, or if you have been counting for 2 hours. Write down the stop time. 6. If you do not feel 10 movements in 2 hours, contact your health care provider for further instructions. Your health care provider may want to do additional tests to assess your baby's well-being. Contact a health care provider if:  You feel fewer than 10 movements in 2 hours.  Your baby is not moving like he or she usually does. Date: ____________ Start time: ____________ Stop time: ____________ Movements: ____________ Date: ____________ Start time: ____________ Stop time: ____________ Movements: ____________ Date: ____________  Start time: ____________ Stop time: ____________ Movements: ____________ Date: ____________ Start time: ____________ Stop time: ____________ Movements: ____________ Date: ____________ Start time: ____________ Stop time: ____________ Movements: ____________ Date: ____________ Start time: ____________ Stop time: ____________ Movements: ____________ Date: ____________ Start time: ____________ Stop time: ____________ Movements: ____________ Date: ____________ Start time: ____________ Stop time: ____________ Movements: ____________ Date: ____________ Start time: ____________ Stop time: ____________ Movements: ____________ This information is not intended to replace advice given to you by your health care provider. Make sure you discuss any questions you have with your health care provider. Document Revised: 12/10/2018 Document Reviewed: 12/10/2018 Elsevier Patient Education  2020 [employer].   Second Trimester of Pregnancy  The second trimester is from week 14 through week 27 (month 4 through 6). This is often the time in pregnancy that you feel your best. Often times, morning sickness has lessened or quit. You may have more energy, and you may get hungry more often. Your unborn baby is growing rapidly. At the end of the sixth month, he or she is about 9 inches long and weighs about 1 pounds. You will likely feel the baby move between 18 and 20 weeks of pregnancy. Follow these instructions at home: Medicines  Take over-the-counter and prescription medicines only as told by your doctor. Some medicines are safe and some medicines are not safe during pregnancy.  Take a prenatal vitamin that contains at least 600 micrograms (mcg) of folic acid.  If you have trouble pooping (constipation), take medicine that will make your stool soft (stool softener) if your doctor  approves. Eating and drinking   Eat regular, healthy meals.  Avoid raw meat and uncooked cheese.  If you get low calcium from the  food you eat, talk to your doctor about taking a daily calcium supplement.  Avoid foods that are high in fat and sugars, such as fried and sweet foods.  If you feel sick to your stomach (nauseous) or throw up (vomit): ? Eat 4 or 5 small meals a day instead of 3 large meals. ? Try eating a few soda crackers. ? Drink liquids between meals instead of during meals.  To prevent constipation: ? Eat foods that are high in fiber, like fresh fruits and vegetables, whole grains, and beans. ? Drink enough fluids to keep your pee (urine) clear or pale yellow. Activity  Exercise only as told by your doctor. Stop exercising if you start to have cramps.  Do not exercise if it is too hot, too humid, or if you are in a place of great height (high altitude).  Avoid heavy lifting.  Wear low-heeled shoes. Sit and stand up straight.  You can continue to have sex unless your doctor tells you not to. Relieving pain and discomfort  Wear a good support bra if your breasts are tender.  Take warm water baths (sitz baths) to soothe pain or discomfort caused by hemorrhoids. Use hemorrhoid cream if your doctor approves.  Rest with your legs raised if you have leg cramps or low back pain.  If you develop puffy, bulging veins (varicose veins) in your legs: ? Wear support hose or compression stockings as told by your doctor. ? Raise (elevate) your feet for 15 minutes, 3-4 times a day. ? Limit salt in your food. Prenatal care  Write down your questions. Take them to your prenatal visits.  Keep all your prenatal visits as told by your doctor. This is important. Safety  Wear your seat belt when driving.  Make a list of emergency phone numbers, including numbers for family, friends, the hospital, and police and fire departments. General instructions  Ask your doctor about the right foods to eat or for help finding a counselor, if you need these services.  Ask your doctor about local prenatal classes.  Begin classes before month 6 of your pregnancy.  Do not use hot tubs, steam rooms, or saunas.  Do not douche or use tampons or scented sanitary pads.  Do not cross your legs for long periods of time.  Visit your dentist if you have not done so. Use a soft toothbrush to brush your teeth. Floss gently.  Avoid all smoking, herbs, and alcohol. Avoid drugs that are not approved by your doctor.  Do not use any products that contain nicotine or tobacco, such as cigarettes and e-cigarettes. If you need help quitting, ask your doctor.  Avoid cat litter boxes and soil used by cats. These carry germs that can cause birth defects in the baby and can cause a loss of your baby (miscarriage) or stillbirth. Contact a doctor if:  You have mild cramps or pressure in your lower belly.  You have pain when you pee (urinate).  You have bad smelling fluid coming from your vagina.  You continue to feel sick to your stomach (nauseous), throw up (vomit), or have watery poop (diarrhea).  You have a nagging pain in your belly area.  You feel dizzy. Get help right away if:  You have a fever.  You are leaking fluid from your vagina.  You have spotting or  bleeding from your vagina.  You have severe belly cramping or pain.  You lose or gain weight rapidly.  You have trouble catching your breath and have chest pain.  You notice sudden or extreme puffiness (swelling) of your face, hands, ankles, feet, or legs.  You have not felt the baby move in over an hour.  You have severe headaches that do not go away when you take medicine.  You have trouble seeing. Summary  The second trimester is from week 14 through week 27 (months 4 through 6). This is often the time in pregnancy that you feel your best.  To take care of yourself and your unborn baby, you will need to eat healthy meals, take medicines only if your doctor tells you to do so, and do activities that are safe for you and your baby.  Call  your doctor if you get sick or if you notice anything unusual about your pregnancy. Also, call your doctor if you need help with the right food to eat, or if you want to know what activities are safe for you. This information is not intended to replace advice given to you by your health care provider. Make sure you discuss any questions you have with your health care provider. Document Revised: 08/14/2018 Document Reviewed: 05/28/2016 Elsevier Patient Education  2020 [employer].

## 2019-12-09 ENCOUNTER — Encounter: Payer: Medicaid Other | Admitting: Obstetrics & Gynecology

## 2019-12-14 ENCOUNTER — Encounter: Payer: Medicaid Other | Admitting: Obstetrics and Gynecology

## 2020-10-31 IMAGING — US US MFM OB TRANSVAGINAL
1 series · 15 of 28 positions shown · non-contrast
Comparison: none

[Series 1: us mfm ob transvaginal · 56 acquisitions, 15 frames shown]
[im 1/56]
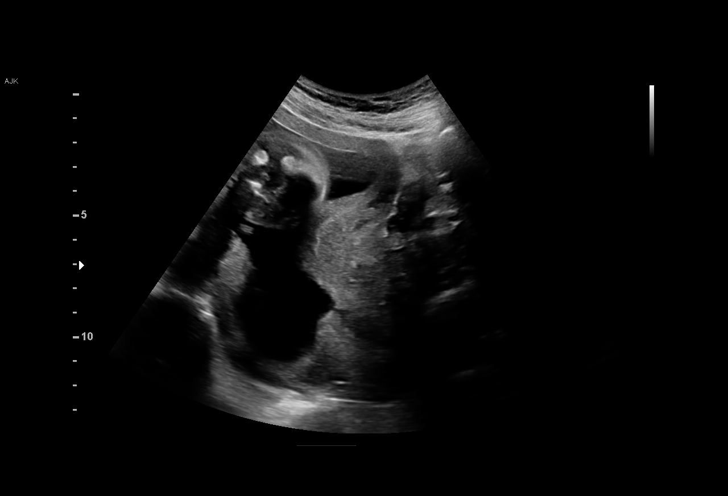
[im 5/56]
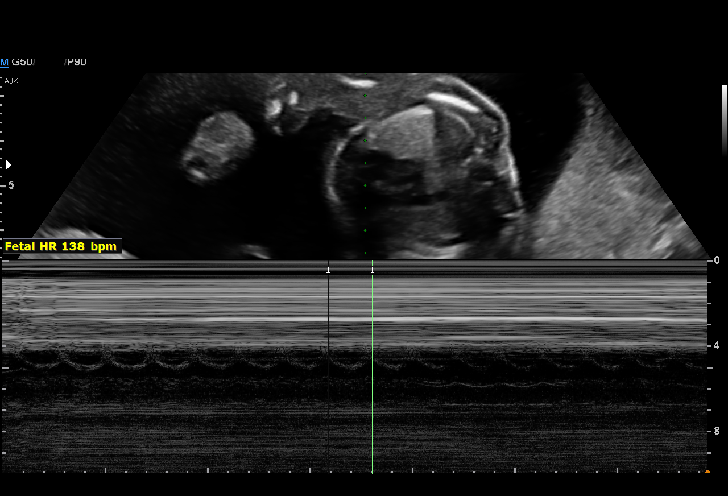
[im 9/56]
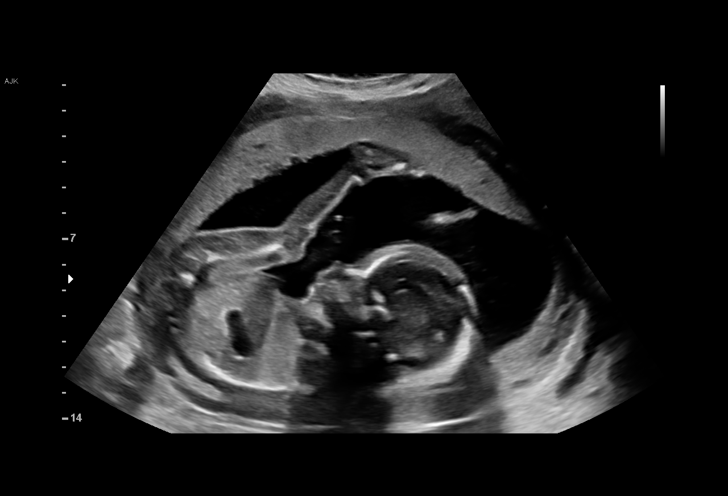
[im 13/56]
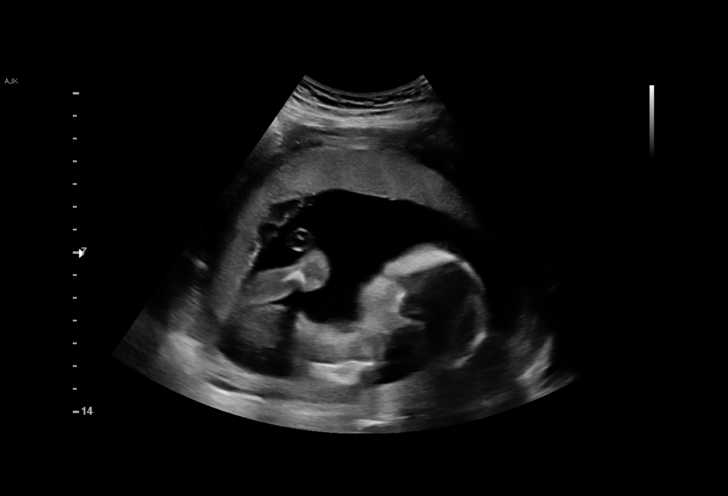
[im 17/56]
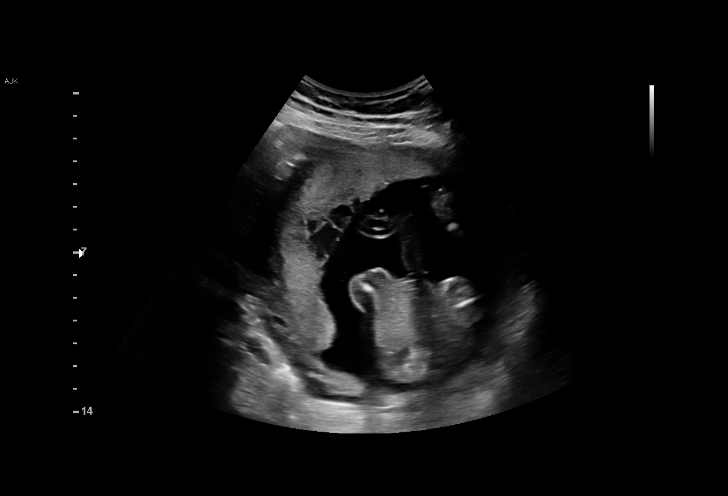
[im 21/56]
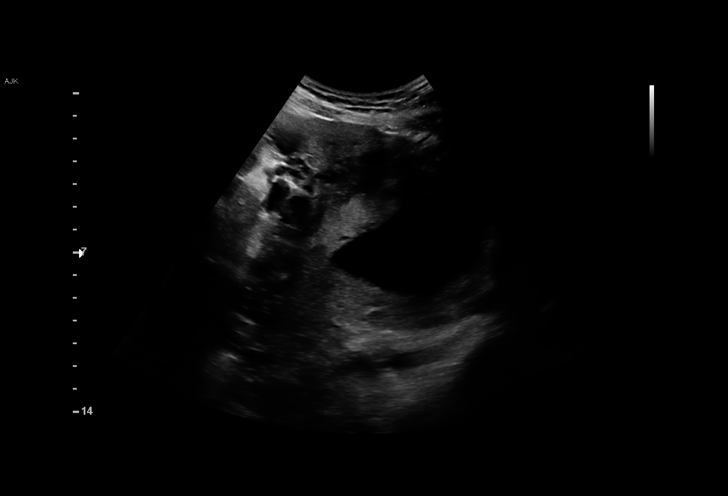
[im 25/56]
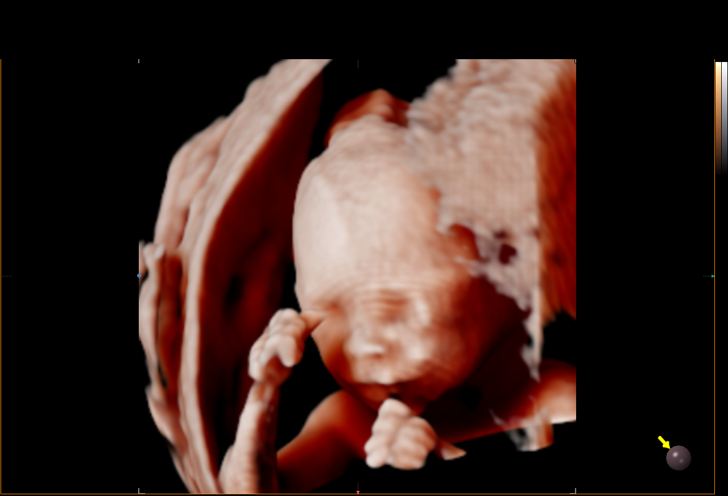
[im 29/56]
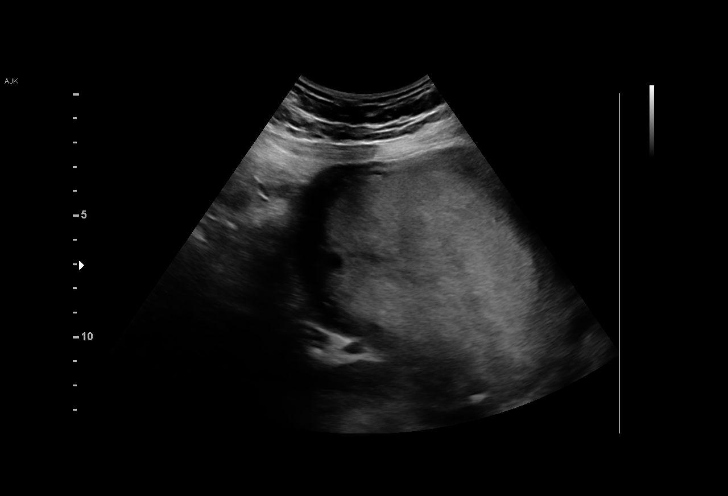
[im 31/56]
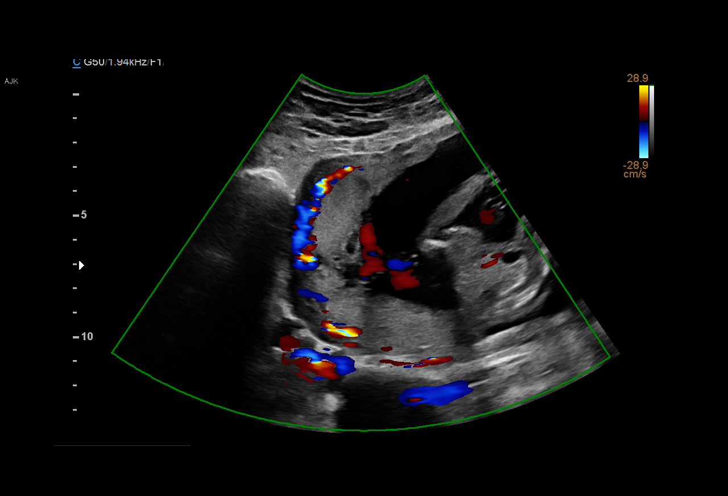
[im 35/56]
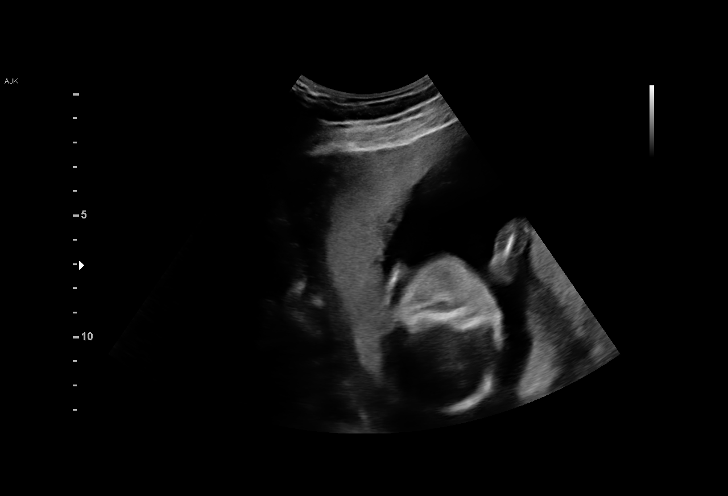
[im 39/56]
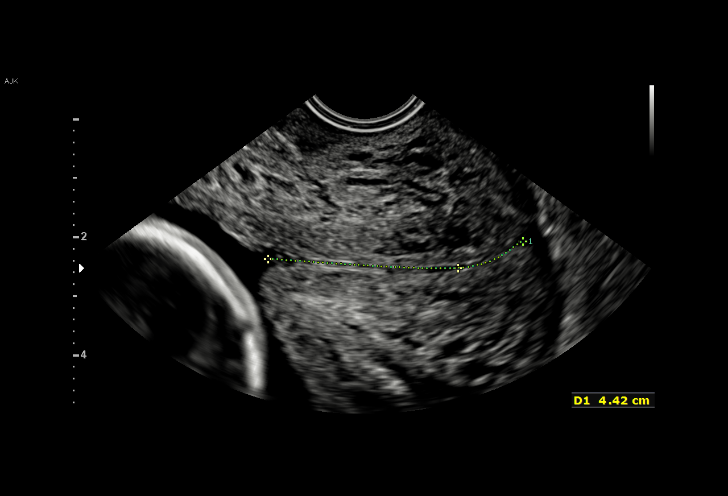
[im 43/56]
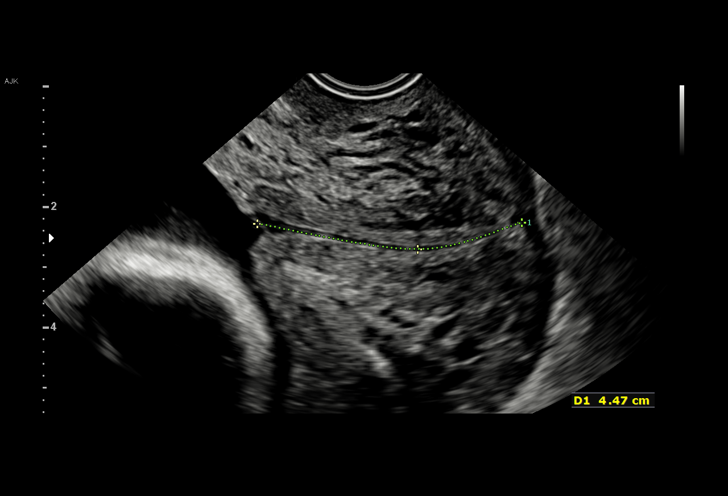
[im 47/56]
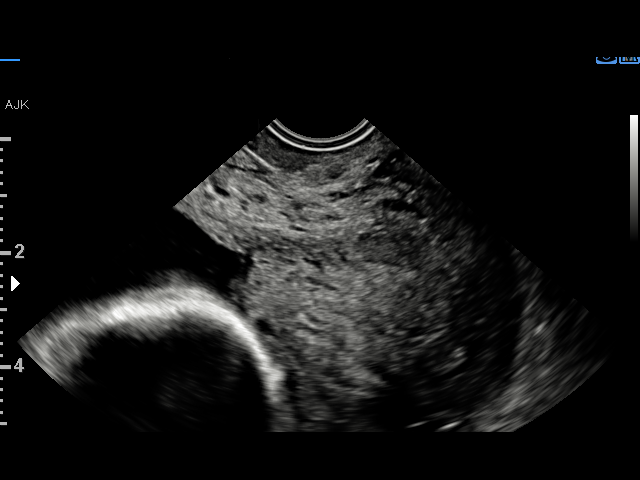
[im 51/56]
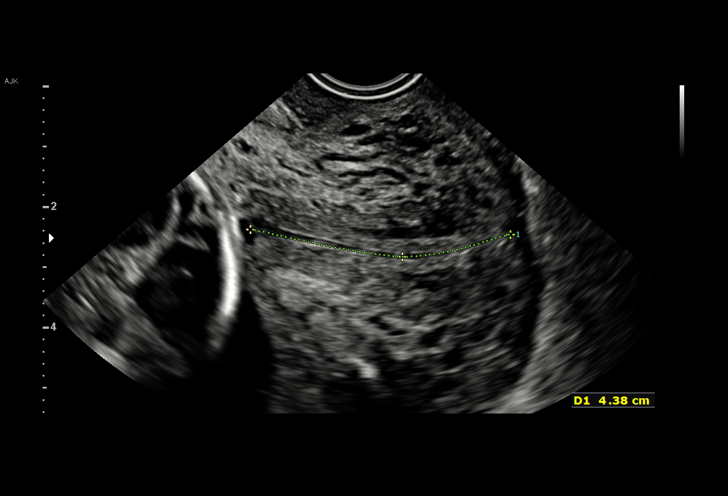
[im 56/56]
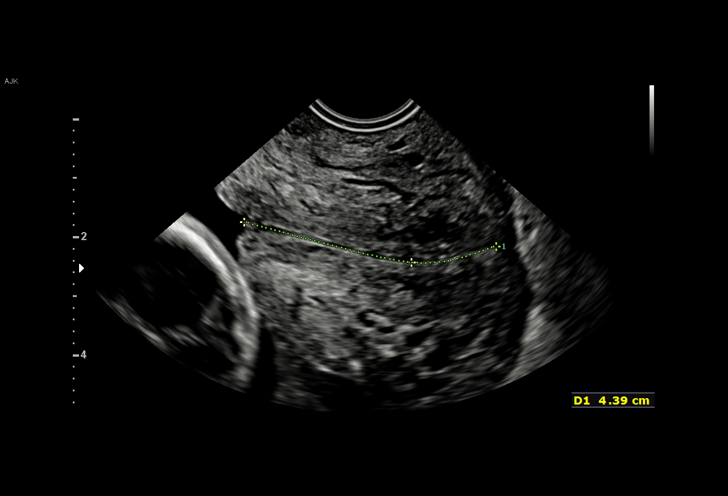

[15 of 28 positions shown; findings below may reference images not displayed]

Indications

 Pelvic pain affecting pregnancy in second
 trimester
 19 weeks gestation of pregnancy
 Poor obstetric history: Previous IUFD
 (stillbirth) @ 20w
Fetal Evaluation

 Num Of Fetuses:         1
 Fetal Heart Rate(bpm):  138
 Cardiac Activity:       Observed
 Presentation:           Breech
 Placenta:               Right lateral
 P. Cord Insertion:      Visualized

 Amniotic Fluid
 AFI FV:      Within normal limits

                             Largest Pocket(cm)


 Comment:    Stomach, bladder, and diaphragm noted. No placental abruption
             or previa identified.
OB History

 Gravidity:    2         Term:   0         SAB:   1
 Living:       0
Gestational Age
 LMP:           17w 3d        Date:  06/08/19                 EDD:   03/14/20
 Best:          19w 6d     Det. By:  Early Ultrasound         EDD:   02/26/20
                                     (07/02/19)
Cervix Uterus Adnexa

 Cervix
 Length:            4.5  cm.
 Normal appearance by transvaginal scan

 Uterus
 No abnormality visualized.

 Right Ovary
 Within normal limits.

 Left Ovary
 Not visualized.

 Adnexa
 No abnormality visualized.
Comments

 A transvaginal ultrasound performed today shows that her
 cervical length is 4.5 cm long without any signs of funneling.

 Normal amniotic fluid is noted today.

 The fetus was in the breech presentation.
